# Patient Record
Sex: Female | Born: 1950 | Race: White | Hispanic: No | Smoking: Former smoker
Health system: Southern US, Community
[De-identification: ages and names within clinical notes are randomized; demographics above are authoritative.]

## PROBLEM LIST (undated history)

## (undated) DIAGNOSIS — M1990 Unspecified osteoarthritis, unspecified site: Secondary | ICD-10-CM

## (undated) DIAGNOSIS — I2510 Atherosclerotic heart disease of native coronary artery without angina pectoris: Secondary | ICD-10-CM

## (undated) DIAGNOSIS — E785 Hyperlipidemia, unspecified: Secondary | ICD-10-CM

## (undated) DIAGNOSIS — H409 Unspecified glaucoma: Secondary | ICD-10-CM

## (undated) DIAGNOSIS — N200 Calculus of kidney: Secondary | ICD-10-CM

## (undated) DIAGNOSIS — H269 Unspecified cataract: Secondary | ICD-10-CM

## (undated) DIAGNOSIS — M797 Fibromyalgia: Secondary | ICD-10-CM

## (undated) DIAGNOSIS — M810 Age-related osteoporosis without current pathological fracture: Secondary | ICD-10-CM

## (undated) HISTORY — DX: Unspecified cataract: H26.9

## (undated) HISTORY — DX: Hyperlipidemia, unspecified: E78.5

## (undated) HISTORY — DX: Calculus of kidney: N20.0

## (undated) HISTORY — PX: EYE SURGERY: SHX253

## (undated) HISTORY — PX: CERVICAL SPINE SURGERY: SHX589

## (undated) HISTORY — DX: Age-related osteoporosis without current pathological fracture: M81.0

## (undated) HISTORY — DX: Unspecified osteoarthritis, unspecified site: M19.90

## (undated) HISTORY — DX: Atherosclerotic heart disease of native coronary artery without angina pectoris: I25.10

## (undated) HISTORY — DX: Unspecified glaucoma: H40.9

## (undated) HISTORY — PX: SPINE SURGERY: SHX786

---

## 2000-01-24 ENCOUNTER — Other Ambulatory Visit: Admission: RE | Admit: 2000-01-24 | Discharge: 2000-01-24 | Payer: Self-pay | Admitting: *Deleted

## 2000-02-03 ENCOUNTER — Ambulatory Visit (HOSPITAL_COMMUNITY): Admission: RE | Admit: 2000-02-03 | Discharge: 2000-02-03 | Payer: Self-pay | Admitting: *Deleted

## 2000-02-03 ENCOUNTER — Encounter: Payer: Self-pay | Admitting: *Deleted

## 2001-01-31 ENCOUNTER — Other Ambulatory Visit: Admission: RE | Admit: 2001-01-31 | Discharge: 2001-01-31 | Payer: Self-pay | Admitting: *Deleted

## 2002-12-27 ENCOUNTER — Ambulatory Visit (HOSPITAL_COMMUNITY): Admission: RE | Admit: 2002-12-27 | Discharge: 2002-12-27 | Payer: Self-pay

## 2006-01-10 ENCOUNTER — Ambulatory Visit (HOSPITAL_COMMUNITY): Admission: RE | Admit: 2006-01-10 | Discharge: 2006-01-10 | Payer: Self-pay | Admitting: Internal Medicine

## 2008-12-17 ENCOUNTER — Ambulatory Visit (HOSPITAL_COMMUNITY): Admission: RE | Admit: 2008-12-17 | Discharge: 2008-12-17 | Payer: Self-pay | Admitting: Internal Medicine

## 2010-01-04 ENCOUNTER — Ambulatory Visit (HOSPITAL_COMMUNITY): Admission: RE | Admit: 2010-01-04 | Discharge: 2010-01-04 | Payer: Self-pay | Admitting: Internal Medicine

## 2010-04-28 ENCOUNTER — Ambulatory Visit (HOSPITAL_BASED_OUTPATIENT_CLINIC_OR_DEPARTMENT_OTHER): Admission: RE | Admit: 2010-04-28 | Payer: Medicare Other | Source: Ambulatory Visit | Admitting: Specialist

## 2011-06-13 ENCOUNTER — Ambulatory Visit (INDEPENDENT_AMBULATORY_CARE_PROVIDER_SITE_OTHER): Payer: Medicare Other | Admitting: Family Medicine

## 2011-06-13 VITALS — BP 147/83 | HR 76 | Temp 97.6°F | Resp 16

## 2011-06-13 DIAGNOSIS — R42 Dizziness and giddiness: Secondary | ICD-10-CM

## 2011-06-13 DIAGNOSIS — I10 Essential (primary) hypertension: Secondary | ICD-10-CM

## 2011-06-13 LAB — POCT CBC
Granulocyte percent: 69.3 %G (ref 37–80)
HCT, POC: 45.9 % (ref 37.7–47.9)
Hemoglobin: 14.8 g/dL (ref 12.2–16.2)
Lymph, poc: 0.9 (ref 0.6–3.4)
MCH, POC: 29.8 pg (ref 27–31.2)
MCHC: 32.2 g/dL (ref 31.8–35.4)
MCV: 92.3 fL (ref 80–97)
MID (cbc): 0.6 (ref 0–0.9)
MPV: 9.1 fL (ref 0–99.8)
POC Granulocyte: 3.3 (ref 2–6.9)
POC LYMPH PERCENT: 19 %L (ref 10–50)
POC MID %: 11.7 %M (ref 0–12)
Platelet Count, POC: 246 10*3/uL (ref 142–424)
RBC: 4.97 M/uL (ref 4.04–5.48)
RDW, POC: 14.3 %
WBC: 4.8 10*3/uL (ref 4.6–10.2)

## 2011-06-13 LAB — GLUCOSE, POCT (MANUAL RESULT ENTRY): POC Glucose: 94

## 2011-06-13 NOTE — Progress Notes (Signed)
Patient Name: Ruth Vaughn Date of Birth: October 23, 1950 Medical Record Number: [id number] Gender: female Date of Encounter: 06/13/2011  History of Present Illness:  Ruth Vaughn is a 61 y.o. very pleasant female patient who presents with the following:  This morning when she awoke she felt a little "disoriented." She felt as though she was not sure if she was still dreaming or awake.  Had an appt with Dr. Elmer Picker for her eyes this morning- she went ahead to her appt.  Felt nauseated and "like I might pass out" while she was there.  This seemed to occur in a few discrete episodes.  She was noted to have elevated BP at Dr. Deirdre Evener and her BP is usually normal to low: "110 or 100 over 90" is her typical BP.   Per Dr. Deirdre Evener note to [place] her BP was 140-150/ 77- 94.  She was given some juice but did not feel much better. She seemed "very nervous/ anxious." She also noted a throbbing in her head-" like I could feel my heart beating in my head."  No significant past medical history except for FBM and glaucoma.  Family history of HTN in several family members but no other heart disease. She denies any CP, no numbness or weakness in her limbs or face. Had noted some chest pressure/ tightness with episodes earlier but this is better now Elli does suffer from [facility] so she sometimes has pains in her chest/ shoulders but has not noted anything different lately.   She did work all day in her garden yesterday and felt fine- no CP or other symptoms Quit smoking about 30 years ago.     [name] is actually feeling quite a bit better now- she is here with her daughter and states that her company helps her feel better.     There is no problem list on file for this patient.  No past medical history on file. No past surgical history on file. History  Substance Use Topics  . Smoking status: Former Games developer  . Smokeless tobacco: Not on file  . Alcohol Use: Not on file   No family history on  file. Allergies  Allergen Reactions  . Codeine Swelling  . Penicillins Rash  . Sulfa Antibiotics Swelling and Rash    Medication list has been reviewed and updated.  Review of Systems: As per HPI- otherwise negative.  Physical Examination: Filed Vitals:   06/13/11 1403  BP: 154/71  Pulse: 80  Temp: 97.6 F (36.4 C)  TempSrc: Oral  Resp: 16  SpO2: 98%   See BP recheck and orthostatics on chart.  Could not find any other BP reading from the past in chart There is no height or weight on file to calculate BMI.  GEN: WDWN, NAD, Non-toxic, A & O x 3 HEENT: Atraumatic, Normocephalic. Neck supple. No masses, No LAD.  PEERL, EOMI, TM and oropharynx wnl Ears and Nose: No external deformity. CV: RRR, No M/G/R. No JVD. No thrill. No extra heart sounds. PULM: CTA B, no wheezes, crackles, rhonchi. No retractions. No resp. distress. No accessory muscle use. ABD: S, NT, ND, +BS. No rebound. No HSM. EXTR: No c/c/e NEURO Normal gait. Normal UE and LE strength and sensation, normal DTR, negative Romberg.  PSYCH: Normally interactive. Conversant. Not depressed or anxious appearing.  Calm demeanor.   Results for orders placed in visit on 06/13/11  POCT CBC      Component Value Range   WBC 4.8  4.6 - 10.2 (  K/uL)   Lymph, poc 0.9  0.6 - 3.4    POC LYMPH PERCENT 19.0  10 - 50 (%L)   MID (cbc) 0.6  0 - 0.9    POC MID % 11.7  0 - 12 (%M)   POC Granulocyte 3.3  2 - 6.9    Granulocyte percent 69.3  37 - 80 (%G)   RBC 4.97  4.04 - 5.48 (M/uL)   Hemoglobin 14.8  12.2 - 16.2 (g/dL)   HCT, POC 16.1  09.6 - 47.9 (%)   MCV 92.3  80 - 97 (fL)   MCH, POC 29.8  27 - 31.2 (pg)   MCHC 32.2  31.8 - 35.4 (g/dL)   RDW, POC 04.5     Platelet Count, POC 246  142 - 424 (K/uL)   MPV 9.1  0 - 99.8 (fL)  GLUCOSE, POCT (MANUAL RESULT ENTRY)      Component Value Range   POC Glucose 94     EKG: sinus rhythm, occasional PVC- no concerning features noted  Assessment and Plan: 1. Lightheaded  EKG 12-Lead,  POCT CBC, POCT glucose (manual entry), TSH  2. Hypertension  EKG 12-Lead, POCT CBC, POCT glucose (manual entry), TSH   Eber Jones noted symptoms that may have been due to a panic attack/ anxiety earlier today.  She is currently feeling a lot better and denies any CP.  Went over the limitations of clinic evaluation with Samhita and her daughter; I cannot rule- out cardiac problems here and cannot know for sure why her BP is elevated today.  A normal EKG is reassuring but is not proof that her heart is ok. Offered ED evaluation for cardiac rule- out- she declined at this time.  She plans to check her BP at the drug store over the next couple of days and will let me know if it continues to run high.  She also declined an outpatient cardiology evaluation at this time.  Again, she agreed to seek care if her symptoms return and will go to the ED if she feels like she did this morning or gets any CP.    Called patient at 8:30 pm to check on her- she denied CP and said that she was feeling ok.  We will also check her TSH as this could be causing her symptoms and follow- up when available

## 2011-06-14 ENCOUNTER — Telehealth: Payer: Self-pay | Admitting: *Deleted

## 2011-06-14 NOTE — Telephone Encounter (Signed)
Called patient to follow up from yesterday's visit.  Patient states that she is feeling better and got plenty of sleep last night.

## 2011-06-15 ENCOUNTER — Encounter: Payer: Self-pay | Admitting: Family Medicine

## 2013-01-28 ENCOUNTER — Ambulatory Visit (INDEPENDENT_AMBULATORY_CARE_PROVIDER_SITE_OTHER): Payer: Medicare Other | Admitting: Family Medicine

## 2013-01-28 VITALS — BP 100/62 | HR 73 | Temp 97.7°F | Resp 18 | Ht 59.0 in | Wt 111.0 lb

## 2013-01-28 DIAGNOSIS — R350 Frequency of micturition: Secondary | ICD-10-CM

## 2013-01-28 DIAGNOSIS — R32 Unspecified urinary incontinence: Secondary | ICD-10-CM

## 2013-01-28 LAB — POCT URINALYSIS DIPSTICK
Glucose, UA: NEGATIVE
Ketones, UA: NEGATIVE
Spec Grav, UA: 1.02
Urobilinogen, UA: 0.2

## 2013-01-28 LAB — POCT UA - MICROSCOPIC ONLY

## 2013-01-28 MED ORDER — CIPROFLOXACIN HCL 250 MG PO TABS: 500.0000 mg | ORAL_TABLET | Freq: Two times a day (BID) | ORAL | Status: DC

## 2013-01-28 NOTE — Patient Instructions (Signed)
Take the cipro twice a day for 5 days. Drink plenty of water and use pyridium if you would like.  If you are not feeling better in the next couple of days please let me konw

## 2013-01-28 NOTE — Progress Notes (Signed)
[facility] [address] [place] [address] [phone]  Date:  01/28/2013   Name:  Ruth Vaughn   DOB:  1950/08/29   MRN:  [id number]  PCP:  Delorse Lek, MD    Chief Complaint: Urinary Tract Infection   History of Present Illness:  Ruth Vaughn is a 62 y.o. very pleasant female patient who presents with the following:  She thinks she may have a UTI.  She has noted sx of urinary frequency, going just a little bit at a time, discomfort with urination.   She has not noted any hematuria.   She noted some sx a couple of weeks ago and seemed to get it under control.  She got better, but her sx worsening over the last couple of days.    No fever or chills.  Her lower back does hurt some.   No vomiting, no diarrhea.  She is eating normally. No vaginal sx She is generally healthy except she is disabled due to a neck injury in the past There are no active problems to display for this patient.   Past Medical History  Diagnosis Date  . Arthritis   . Cataract   . Glaucoma   . Osteoporosis     Past Surgical History  Procedure Laterality Date  . Cesarean section    . Eye surgery    . Spine surgery      History  Substance Use Topics  . Smoking status: Former Games developer  . Smokeless tobacco: Not on file  . Alcohol Use: Not on file    History reviewed. No pertinent family history.  Allergies  Allergen Reactions  . Codeine Swelling  . Erythromycin   . Penicillins Rash  . Sulfa Antibiotics Swelling and Rash    Medication list has been reviewed and updated.  No current outpatient prescriptions on file prior to visit.   No current facility-administered medications on file prior to visit.    Review of Systems:  As per HPI- otherwise negative.   Physical Examination: Filed Vitals:   01/28/13 1403  BP: 100/62  Pulse: 73  Temp: 97.7 F (36.5 C)  Resp: 18   Filed Vitals:   01/28/13 1403  Height: 4\' 11"  (1.499 m)  Weight: 111  lb (50.349 kg)   Body mass index is 22.41 kg/(m^2). Ideal Body Weight: Weight in (lb) to have BMI = 25: 123.5  GEN: WDWN, NAD, Non-toxic, A & O x 3 HEENT: Atraumatic, Normocephalic. Neck supple. No masses, No LAD. Ears and Nose: No external deformity. CV: RRR, No M/G/R. No JVD. No thrill. No extra heart sounds. PULM: CTA B, no wheezes, crackles, rhonchi. No retractions. No resp. distress. No accessory muscle use. ABD: S, NT, ND. No rebound. No HSM. EXTR: No c/c/e NEURO Normal gait.  PSYCH: Normally interactive. Conversant. Not depressed or anxious appearing.  Calm demeanor.  No CVA tenderness   Results for orders placed in visit on 01/28/13  POCT URINALYSIS DIPSTICK      Result Value Range   Color, UA yellow     Clarity, UA cloudy     Glucose, UA neg     Bilirubin, UA neg     Ketones, UA neg     Spec Grav, UA 1.020     Blood, UA moderate     pH, UA 5.5     Protein, UA neg     Urobilinogen, UA 0.2     Nitrite, UA neg  Leukocytes, UA moderate (2+)    POCT UA - MICROSCOPIC ONLY      Result Value Range   WBC, Ur, HPF, POC tntc     RBC, urine, microscopic 3-6     Bacteria, U Microscopic trace     Mucus, UA neg     Epithelial cells, urine per micros 0-1     Crystals, Ur, HPF, POC tntc uric acid crystals     Casts, Ur, LPF, POC 3-6 calcium oxalates     Yeast, UA neg       Assessment and Plan: Frequent urination - Plan: POCT urinalysis dipstick, POCT UA - Microscopic Only, ciprofloxacin (CIPRO) 250 MG tablet, Urine culture  Bladder incontinence - Plan: POCT urinalysis dipstick, POCT UA - Microscopic Only  cipro for UTI.  Await urine culture  See patient instructions for more details.     Signed Abbe Amsterdam, MD

## 2013-01-31 LAB — URINE CULTURE

## 2015-12-17 LAB — VITAMIN D 25 HYDROXY (VIT D DEFICIENCY, FRACTURES)
VIT D 25 HYDROXY: 56.4
Vit D, 25-Hydroxy: 56.4

## 2015-12-17 LAB — CBC AND DIFFERENTIAL
HEMATOCRIT: 46 (ref 36–46)
HEMATOCRIT: 46 (ref 36–46)
HEMOGLOBIN: 15.1 (ref 12.0–16.0)
Hemoglobin: 15.1 (ref 12.0–16.0)
PLATELETS: 244 (ref 150–399)
Platelets: 244 (ref 150–399)
WBC: 4.5
WBC: 4.5

## 2015-12-17 LAB — TSH
TSH: 1.52 (ref 0.41–5.90)
TSH: 1.52 (ref 0.41–5.90)

## 2015-12-17 LAB — VITAMIN B12: VITAMIN B 12: 154

## 2016-03-15 DIAGNOSIS — R5383 Other fatigue: Secondary | ICD-10-CM | POA: Diagnosis not present

## 2016-03-15 DIAGNOSIS — D519 Vitamin B12 deficiency anemia, unspecified: Secondary | ICD-10-CM | POA: Diagnosis not present

## 2016-05-03 DIAGNOSIS — L821 Other seborrheic keratosis: Secondary | ICD-10-CM | POA: Diagnosis not present

## 2016-05-03 DIAGNOSIS — L603 Nail dystrophy: Secondary | ICD-10-CM | POA: Diagnosis not present

## 2016-06-24 ENCOUNTER — Ambulatory Visit: Payer: Medicare Other | Admitting: Family Medicine

## 2016-07-25 ENCOUNTER — Ambulatory Visit (INDEPENDENT_AMBULATORY_CARE_PROVIDER_SITE_OTHER): Payer: PPO | Admitting: Family Medicine

## 2016-07-25 VITALS — BP 105/62 | HR 77 | Temp 97.9°F | Ht 59.0 in | Wt 115.0 lb

## 2016-07-25 DIAGNOSIS — F430 Acute stress reaction: Secondary | ICD-10-CM

## 2016-07-25 DIAGNOSIS — F339 Major depressive disorder, recurrent, unspecified: Secondary | ICD-10-CM | POA: Diagnosis not present

## 2016-07-25 DIAGNOSIS — R252 Cramp and spasm: Secondary | ICD-10-CM

## 2016-07-25 DIAGNOSIS — E538 Deficiency of other specified B group vitamins: Secondary | ICD-10-CM

## 2016-07-25 MED ORDER — VENLAFAXINE HCL ER 37.5 MG PO CP24: 37.5000 mg | ORAL_CAPSULE | Freq: Every day | ORAL | 6 refills | Status: DC

## 2016-07-25 NOTE — Patient Instructions (Addendum)
It was nice to see you again today!   We will have you start on effexor 37.5 one a day, and increase to 2 pills after 1-2 weeks if needed We will check your B12 today We will also look for any electrolyte issue that could be causing your cramps

## 2016-07-25 NOTE — Progress Notes (Addendum)
[name] [address], [place], [place] [address] [phone]  Date:  07/25/2016   Name:  Ruth Vaughn   DOB:  July 27, 1950   MRN:  [id number]  PCP:  Darreld Mclean, MD    Chief Complaint: Establish Care (Needs B12 injection today.)   History of Present Illness:  Ruth Vaughn is a 66 y.o. very pleasant female patient who presents with the following:  Here today as a new patient to this practice but I do know her from [facility]- last saw her there in 2014 She has not really had a local PCP recently- she lives near this office now and wanted to establish care.    She does have a history of B12 def- she has been doing shots once a week at home (a friend will give it to her).  However she has been out for a few weeks and wonders if she still needs to get these shots.  We will check her level today  She did get some labs done in January- however we will have to get her results from her last provider She has complaint of feeling very tired- will wake up feeling tired.  This has been doing on for a year or so.   She does not feel sleepy per se, but will feel "like I"m dragging."  At night she generally gets 6 + hours of sleep.  She does not think that she has OSA as her sx are not somnolence but fatigue, feeling like she is not motivated to do things   No CP or SOB She notes a history of osteopenia/ osteoporosis   Admits to being under a lot of stress recently her mother died, she had to put her dog to sleep, and her grown son had a nervous breakdown which required psychiatric treatment.  He is doing better but is living with her again.  All in all she suspects that she has been depressed for the last several months  She did try wellbutrin years ago and did not like it - however she would be interested in trying a different medication She was also treated for depression in the 1980s but does not recall what she  took She does not feel that anxiety is a bit issue for her- more lack of energy Denies any SI  She has noted muscle cramps over the last couple of year at night- mostly in her legs, occasionally in her hands if she has been using them a lot (knitting)  Patient Active Problem List   Diagnosis Date Noted  . Vitamin B12 deficiency 07/25/2016  . Depression, recurrent ([place]) 07/25/2016    Past Medical History:  Diagnosis Date  . Arthritis   . Cataract   . Glaucoma   . Osteoporosis     Past Surgical History:  Procedure Laterality Date  . CESAREAN SECTION    . EYE SURGERY    . SPINE SURGERY      Social History  Substance Use Topics  . Smoking status: Former Research scientist (life sciences)  . Smokeless tobacco: Not on file  . Alcohol use Not on file    No family history on file.  Allergies  Allergen Reactions  . Codeine Swelling  . Erythromycin   . Penicillins Rash  . Sulfa Antibiotics Swelling and Rash    Medication list has been reviewed and updated.  Current Outpatient Prescriptions on File Prior to Visit  Medication Sig  Dispense Refill  . Cholecalciferol (VITAMIN D PO) Take by mouth.    . Fish Oil OIL by Does not apply route.     No current facility-administered medications on file prior to visit.     Review of Systems:  As per HPI- otherwise negative.   Physical Examination: Vitals:   07/25/16 1414  BP: 105/62  Pulse: 77  Temp: 97.9 F (36.6 C)   Vitals:   07/25/16 1414  Weight: 115 lb (52.2 kg)  Height: 4\' 11"  (1.499 m)   Body mass index is 23.23 kg/m. Ideal Body Weight: Weight in (lb) to have BMI = 25: 123.5  GEN: WDWN, NAD, Non-toxic, A & O x 3, normal weight, looks well HEENT: Atraumatic, Normocephalic. Neck supple. No masses, No LAD. Ears and Nose: No external deformity. CV: RRR, No M/G/R. No JVD. No thrill. No extra heart sounds. PULM: CTA B, no wheezes, crackles, rhonchi. No retractions. No resp. distress. No accessory muscle use. ABD: S, NT, ND, +BS. No  rebound. No HSM. EXTR: No c/c/e NEURO Normal gait.  PSYCH: Normally interactive. Conversant. Not depressed or anxious appearing.  Calm demeanor.    Assessment and Plan: ,Depression, recurrent ([place]) - Plan: venlafaxine XR (EFFEXOR-XR) 37.5 MG 24 hr capsule  Vitamin B12 deficiency - Plan: B12  Muscle cramps - Plan: Basic metabolic panel  Acute stress reaction  Here today with a few concerns Her main problem is depression for the kast few months Will try her on effexor starting at 37.5- can increase after 1-2 weeks Will check on her b12 level and also BMP given muscle cramps  Will plan further follow- up pending labs. Asked her to keep me posted regarding her response to effexor via mychart   Signed Lamar Blinks, MD  Received her labs   Results for orders placed or performed in visit on 07/25/16  B12  Result Value Ref Range   Vitamin B-12 959 (H) 211 - 911 pg/mL  Basic metabolic panel  Result Value Ref Range   Sodium 141 135 - 145 mEq/L   Potassium 3.7 3.5 - 5.1 mEq/L   Chloride 106 96 - 112 mEq/L   CO2 28 19 - 32 mEq/L   Glucose, Bld 100 (H) 70 - 99 mg/dL   BUN 13 6 - 23 mg/dL   Creatinine, Ser 0.87 0.40 - 1.20 mg/dL   Calcium 9.6 8.4 - 10.5 mg/dL   GFR 69.32 >60.00 mL/min   Message to pt

## 2016-07-26 ENCOUNTER — Encounter: Payer: Self-pay | Admitting: Family Medicine

## 2016-07-26 LAB — BASIC METABOLIC PANEL
BUN: 13 mg/dL (ref 6–23)
CO2: 28 mEq/L (ref 19–32)
CREATININE: 0.87 mg/dL (ref 0.40–1.20)
Calcium: 9.6 mg/dL (ref 8.4–10.5)
Chloride: 106 mEq/L (ref 96–112)
GFR: 69.32 mL/min (ref >60.00)
Glucose, Bld: 100 mg/dL — ABNORMAL HIGH (ref 70–99)
POTASSIUM: 3.7 meq/L (ref 3.5–5.1)
Sodium: 141 mEq/L (ref 135–145)

## 2016-07-26 LAB — VITAMIN B12: Vitamin B-12: 959 pg/mL — ABNORMAL HIGH (ref 211–911)

## 2016-07-28 DIAGNOSIS — H04123 Dry eye syndrome of bilateral lacrimal glands: Secondary | ICD-10-CM | POA: Diagnosis not present

## 2016-07-28 DIAGNOSIS — H1852 Epithelial (juvenile) corneal dystrophy: Secondary | ICD-10-CM | POA: Diagnosis not present

## 2016-07-28 DIAGNOSIS — H401131 Primary open-angle glaucoma, bilateral, mild stage: Secondary | ICD-10-CM | POA: Diagnosis not present

## 2016-08-30 ENCOUNTER — Telehealth: Payer: Self-pay | Admitting: Family Medicine

## 2016-08-30 ENCOUNTER — Emergency Department (HOSPITAL_BASED_OUTPATIENT_CLINIC_OR_DEPARTMENT_OTHER)
Admission: EM | Admit: 2016-08-30 | Discharge: 2016-08-30 | Disposition: A | Discharge status: Home / Self Care | Payer: PPO | Attending: Emergency Medicine | Admitting: Emergency Medicine

## 2016-08-30 ENCOUNTER — Encounter (HOSPITAL_BASED_OUTPATIENT_CLINIC_OR_DEPARTMENT_OTHER): Payer: Self-pay

## 2016-08-30 ENCOUNTER — Emergency Department (HOSPITAL_BASED_OUTPATIENT_CLINIC_OR_DEPARTMENT_OTHER): Payer: PPO

## 2016-08-30 DIAGNOSIS — R072 Precordial pain: Secondary | ICD-10-CM | POA: Insufficient documentation

## 2016-08-30 DIAGNOSIS — Z87891 Personal history of nicotine dependence: Secondary | ICD-10-CM | POA: Insufficient documentation

## 2016-08-30 DIAGNOSIS — R079 Chest pain, unspecified: Secondary | ICD-10-CM | POA: Diagnosis not present

## 2016-08-30 HISTORY — DX: Fibromyalgia: M79.7

## 2016-08-30 LAB — CBC
HCT: 46.8 % — ABNORMAL HIGH (ref 36.0–46.0)
HEMOGLOBIN: 16 g/dL — AB (ref 12.0–15.0)
MCH: 31.4 pg (ref 26.0–34.0)
MCHC: 34.2 g/dL (ref 30.0–36.0)
MCV: 91.9 fL (ref 78.0–100.0)
Platelets: 237 10*3/uL (ref 150–400)
RBC: 5.09 MIL/uL (ref 3.87–5.11)
RDW: 13.9 % (ref 11.5–15.5)
WBC: 5.6 10*3/uL (ref 4.0–10.5)

## 2016-08-30 LAB — TROPONIN I
Troponin I: <0.03 ng/mL (ref <0.03)
Troponin I: <0.03 ng/mL (ref <0.03)

## 2016-08-30 LAB — BASIC METABOLIC PANEL
ANION GAP: 11 (ref 5–15)
BUN: 12 mg/dL (ref 6–20)
CHLORIDE: 102 mmol/L (ref 101–111)
CO2: 27 mmol/L (ref 22–32)
Calcium: 9.6 mg/dL (ref 8.9–10.3)
Creatinine, Ser: 0.93 mg/dL (ref 0.44–1.00)
GFR calc non Af Amer: >60 mL/min (ref >60)
Glucose, Bld: 108 mg/dL — ABNORMAL HIGH (ref 65–99)
POTASSIUM: 4.5 mmol/L (ref 3.5–5.1)
Sodium: 140 mmol/L (ref 135–145)

## 2016-08-30 LAB — D-DIMER, QUANTITATIVE: D-Dimer, Quant: <0.27 ug/mL-FEU (ref 0.00–0.50)

## 2016-08-30 MED ORDER — ASPIRIN 81 MG PO CHEW
324.0000 mg | CHEWABLE_TABLET | Freq: Once | ORAL | Status: AC
  Administered 2016-08-30: 324 mg via ORAL
  Filled 2016-08-30: qty 4

## 2016-08-30 NOTE — Telephone Encounter (Signed)
Pt called in to schedule an apt . Pt says that she is having chest pain and feels very tired. Transferred pt to [facility] for triaging.

## 2016-08-30 NOTE — Telephone Encounter (Signed)
Patient Name: Ruth Vaughn  Gender: Female  DOB: 03-26-1950   Age: 66 Y 15 M 18 D  Return Phone Number: [phone] (Primary)  Address:   City/State/Zip: [place]    [employer]ate [employer] [facility] - Client  Client Site [facility] - Day  Physician Copland, Janett Billow- MD  Contact Type Call  Who Is Calling Patient / Member / Family / Caregiver  Call Type Triage / Clinical  Relationship To Patient Self  Return Phone Number [phone] (Primary)  Chief Complaint CHEST PAIN (>=21 years) - pain, pressure, heaviness or tightness  Reason for Call Symptomatic / Request for [place] states she has chest pain, nausea and fatigue  Appointment Disposition EMR Appointment Not Necessary  PreDisposition Call Doctor  Translation No   Nurse Assessment  Nurse: Leilani Merl, RN, Heather Date/Time (Eastern Time): 08/30/2016 11:14:14 AM  Confirm and document reason for call. If symptomatic, describe symptoms. ---Caller states she has chest pain, nausea and fatigue. The fatigue started weeks ago, the chest pain started a couple of days ago. Nausea started a couple of days ago.  Does the patient have any new or worsening symptoms? ---Yes  Will a triage be completed? ---Yes  Related visit to physician within the last 2 weeks? ---No  Does the PT have any chronic conditions? (i.e. diabetes, asthma, etc.) ---Yes  List chronic conditions. ---See MR  Is this a behavioral health or substance abuse call? ---No     Guidelines      Guideline Title Affirmed Question Affirmed Notes Nurse Date/Time Eilene Ghazi Time)  Chest Pain [1] Chest pain lasts > 5 minutes AND [2] age > 98  Standifer, RN, Nira Conn 08/30/2016 11:17:26 AM   Disp. Time Eilene Ghazi Time) Disposition Final User   08/30/2016 11:12:05 AM Send To Call Back Waiting For Nurse  [facility]   08/30/2016 11:12:05 AM Send To Call Back Waiting For Nurse  [facility]   08/30/2016 11:12:08 AM Send to  Urgent Queue  [facility]   08/30/2016 11:23:03 AM 911 Outcome Documentation  [place], RN, Nira Conn    Reason: Caller states that she will go to the ED, but she will not call 911 to take her.      08/30/2016 11:19:52 AM Call EMS 911 Now Yes Standifer, RN, Soyla Murphy Understands: Yes  Disagree/Comply: Comply     Care Advice Given Per Guideline      CALL EMS 911 NOW: Immediate medical attention is needed. You need to hang up and call 911 (or an ambulance). Psychologist, forensic Discretion: I'll call you back in a few minutes to be sure you were able to reach them.) CARE ADVICE given per Chest Pain (Adult) guideline.   Referrals  [facility] - ED

## 2016-08-30 NOTE — ED Triage Notes (Signed)
C/o chest tightness x 1-2 weeks-later became sharp in nature-NAD-steady gait

## 2016-08-30 NOTE — ED Provider Notes (Signed)
[facility] DEPT [facility] Provider Note   CSN: [id number] Arrival date & time: 08/30/16  1134     History   Chief Complaint Chief Complaint  Patient presents with  . Chest Pain    HPI Ruth Vaughn is a 66 y.o. female.  The history is provided by the patient and medical records. No language interpreter was used.  Chest Pain   This is a new problem. The problem occurs rarely. The problem has not changed since onset.The pain is associated with breathing. The pain is present in the substernal region. The pain is at a severity of 3/10. The pain is mild. The quality of the pain is described as brief, pleuritic and sharp. The pain does not radiate. Duration of episode(s) is 5 minutes. The symptoms are aggravated by deep breathing. Associated symptoms include nausea and shortness of breath. Pertinent negatives include no abdominal pain, no back pain, no claudication, no cough, no diaphoresis, no dizziness, no exertional chest pressure, no fever, no headaches, no hemoptysis, no irregular heartbeat, no leg pain, no lower extremity edema, no malaise/fatigue, no near-syncope, no numbness, no palpitations, no sputum production, no syncope and no vomiting. She has tried nothing for the symptoms. The treatment provided no relief.    Past Medical History:  Diagnosis Date  . Arthritis   . Cataract   . Fibromyalgia   . Glaucoma   . Osteoporosis     Patient Active Problem List   Diagnosis Date Noted  . Vitamin B12 deficiency 07/25/2016  . Depression, recurrent ([place]) 07/25/2016    Past Surgical History:  Procedure Laterality Date  . CERVICAL SPINE SURGERY    . CESAREAN SECTION    . EYE SURGERY    . SPINE SURGERY      OB History    No data available       Home Medications    Prior to Admission medications   Medication Sig Start Date End Date Taking? Authorizing Provider  b complex vitamins tablet Take 1 tablet by mouth daily.    [provider]  Cholecalciferol  (VITAMIN D PO) Take by mouth.    [provider]  cyanocobalamin (,VITAMIN B-12,) 1000 MCG/ML injection Inject 1,000 mcg into the muscle once.    [provider]  Fish Oil OIL by Does not apply route.    [provider]  THEANINE PO Take 200 mg by mouth.    [provider]    Family History No family history on file.  Social History Social History  Substance Use Topics  . Smoking status: Former Research scientist (life sciences)  . Smokeless tobacco: Never Used  . Alcohol use Yes     Comment: daily     Allergies   Codeine; Erythromycin; Penicillins; and Sulfa antibiotics   Review of Systems Review of Systems  Constitutional: Negative for appetite change, chills, diaphoresis, fatigue, fever and malaise/fatigue.  HENT: Negative for congestion and rhinorrhea.   Eyes: Negative for visual disturbance.  Respiratory: Positive for shortness of breath. Negative for cough, hemoptysis, sputum production, chest tightness and stridor.   Cardiovascular: Positive for chest pain. Negative for palpitations, claudication, leg swelling, syncope and near-syncope.  Gastrointestinal: Positive for nausea. Negative for abdominal pain, constipation, diarrhea and vomiting.  Genitourinary: Negative for dysuria.  Musculoskeletal: Negative for back pain.  Neurological: Negative for dizziness, light-headedness, numbness and headaches.  Psychiatric/Behavioral: Negative for agitation.  All other systems reviewed and are negative.    Physical Exam Updated Vital Signs BP (!) 143/79 (BP Location: Right  Arm)   Pulse 74   Temp 98.4 F (36.9 C) (Oral)   Resp 20   Ht 4\' 11"  (1.499 m)   Wt 52.6 kg (116 lb)   SpO2 100%   BMI 23.43 kg/m   Physical Exam  Constitutional: She is oriented to person, place, and time. She appears well-developed and well-nourished. No distress.  HENT:  Head: Normocephalic and atraumatic.  Right Ear: External ear normal.  Left Ear: External ear normal.  Nose: Nose  normal.  Mouth/Throat: Oropharynx is clear and moist. No oropharyngeal exudate.  Eyes: Conjunctivae and EOM are normal. Pupils are equal, round, and reactive to light.  Neck: Normal range of motion. Neck supple.  Cardiovascular: Normal rate, normal heart sounds and intact distal pulses.   No murmur heard. Pulmonary/Chest: Effort normal and breath sounds normal. No stridor. No respiratory distress. She has no wheezes. She exhibits no tenderness.  Abdominal: She exhibits no distension. There is no tenderness. There is no rebound.  Musculoskeletal: She exhibits no tenderness.  Neurological: She is alert and oriented to person, place, and time. She has normal reflexes. She exhibits normal muscle tone. Coordination normal.  Skin: Skin is warm. Capillary refill takes less than 2 seconds. No rash noted. She is not diaphoretic. No erythema.  Nursing note and vitals reviewed.    ED Treatments / Results  Labs (all labs ordered are listed, but only abnormal results are displayed) Labs Reviewed  BASIC METABOLIC PANEL - Abnormal; Notable for the following:       Result Value   Glucose, Bld 108 (*)    All other components within normal limits  CBC - Abnormal; Notable for the following:    Hemoglobin 16.0 (*)    HCT 46.8 (*)    All other components within normal limits  TROPONIN I  D-DIMER, QUANTITATIVE (NOT AT [facility])  TROPONIN I    EKG  EKG Interpretation  Date/Time:  Tuesday August 30 2016 11:44:09 EDT Ventricular Rate:  78 PR Interval:  114 QRS Duration: 80 QT Interval:  358 QTC Calculation: 408 R Axis:   75 Text Interpretation:  Normal sinus rhythm Normal ECG No prior ECG for comparison No STEMI Confirmed by Antony Blackbird [phone]) on 08/30/2016 12:34:34 PM       Radiology Dg Chest 2 View  Result Date: 08/30/2016 CLINICAL DATA:  Fatigue for several months, chest pain, EXAM: CHEST  2 VIEW COMPARISON:  None. FINDINGS: No active infiltrate or effusion is seen. Mediastinal and hilar  contours are unremarkable. On the lateral view of the chest there is a vague nodular opacity posteriorly which may simply be due to overlapping bony and vascular structures, but attention to this area on followup chest x-ray is recommended. The heart is within normal limits in size. No acute bony abnormality is seen. IMPRESSION: 1. No active cardiopulmonary disease. 2. Vague nodular opacity seen only on the lateral view. Possible superimposed vascular and bony structures, but attention to this area on followup chest x-ray is recommended. Electronically Signed   By: Ivar Drape M.D.   On: 08/30/2016 12:37    Procedures Procedures (including critical care time)  Medications Ordered in ED Medications  aspirin chewable tablet 324 mg (324 mg Oral Given 08/30/16 1254)     Initial Impression / Assessment and Plan / ED Course  I have reviewed the triage vital signs and the nursing notes.  Pertinent labs & imaging results that were available during my care of the patient were reviewed by me and considered  in my medical decision making (see chart for details).     Ruth Vaughn is a 66 y.o. female with past medical history significant for fibromyalgia who presents with chest pain. Patient says that over the last 2 weeks she has had intermittent chest discomfort. She says it was tightness but then changed into a sharp pain. She says that she had some mild associated nausea and shortness of breath but denies any vomiting. She denies any palpitations or syncope. She denies lightheadedness. She does report that her pain is worsened with deep breathing. She denies any recent chest trauma. She denies fevers, chills, congestion, cough, sensation, diarrhea, or dysuria. She says the pain is currently 3 out of 10 in severity. She denies any other symptoms including no leg pain or leg swelling. No significant fatigue.  History and exam are seen above. On exam, patient's lungs are clear. Chest is nontender however  she did have pain with deep breathing on exam. No abdominal tenderness. Symmetric pulses in upper extremity. No focal neurologic deficits.  Patient given aspirin during initial workup for chest pain.  Heart score calculated as a 2. EKG unremarkable. Laboratory testing showed elevated hemoglobin but otherwise no evidence of acute abnormalities. D-dimer negative, doubt PE. Troponin negative 2.  Patient had no further chest pain in the ED.  Given reassuring workup and heart score less than 3 with negative troponins, feel patient is safe for outpatient management of chest pain. Patient reports that she has been feeling anxious with the pain and thinks that it may be partially an anxiety attack. Patient advised to follow-up with PCP and likely a cardiologist for further management. Patient understood return precautions for any new or worsened symptoms. Patient had no other questions or concerns and was discharged in good condition.        Final Clinical Impressions(s) / ED Diagnoses   Final diagnoses:  Precordial chest pain    New Prescriptions Discharge Medication List as of 08/30/2016  3:40 PM      Clinical Impression: 1. Precordial chest pain     Disposition: Discharge  Condition: Good  I have discussed the results, Dx and Tx plan with the pt(& family if present). He/she/they expressed understanding and agree(s) with the plan. Discharge instructions discussed at great length. Strict return precautions discussed and pt &/or family have verbalized understanding of the instructions. No further questions at time of discharge.    Discharge Medication List as of 08/30/2016  3:40 PM      Follow Up: Darreld Mclean, MD [address] [place] [address] [phone]  Schedule an appointment as soon as possible for a visit    [facility] [address] [id number] [place] [id number] [phone]  If  symptoms worsen     Una Yeomans, Gwenyth Allegra, MD 08/30/16 1556

## 2016-08-30 NOTE — Discharge Instructions (Signed)
Please schedule a follow-up appointment with your primary care physician for further workup and management of your chest discomfort. Your workup today was reassuring. If any symptoms change or worsen, please return to the nearest emergency department.

## 2016-08-30 NOTE — ED Notes (Signed)
Patient transported to X-ray 

## 2016-11-07 ENCOUNTER — Encounter: Payer: Self-pay | Admitting: Family Medicine

## 2016-11-20 ENCOUNTER — Telehealth: Payer: Self-pay | Admitting: Family Medicine

## 2016-11-20 NOTE — Telephone Encounter (Signed)
Received her records from [facility]- will abstract/ scan as needed

## 2016-11-21 ENCOUNTER — Encounter: Payer: Self-pay | Admitting: Family Medicine

## 2016-11-21 NOTE — Progress Notes (Signed)
MCV: 94 T4 FREE: 1.2 VIT B12: 154 T3, TOTAL: 93

## 2016-11-25 ENCOUNTER — Ambulatory Visit (INDEPENDENT_AMBULATORY_CARE_PROVIDER_SITE_OTHER): Payer: PPO | Admitting: Podiatry

## 2016-11-25 ENCOUNTER — Ambulatory Visit (INDEPENDENT_AMBULATORY_CARE_PROVIDER_SITE_OTHER): Payer: PPO

## 2016-11-25 ENCOUNTER — Encounter: Payer: Self-pay | Admitting: Podiatry

## 2016-11-25 DIAGNOSIS — M21619 Bunion of unspecified foot: Secondary | ICD-10-CM

## 2016-11-25 DIAGNOSIS — B353 Tinea pedis: Secondary | ICD-10-CM | POA: Diagnosis not present

## 2016-11-25 DIAGNOSIS — L603 Nail dystrophy: Secondary | ICD-10-CM | POA: Diagnosis not present

## 2016-11-25 MED ORDER — KETOCONAZOLE 2 % EX CREA: 1.0000 "application " | TOPICAL_CREAM | Freq: Two times a day (BID) | CUTANEOUS | 1 refills | Status: DC

## 2016-11-25 NOTE — Progress Notes (Signed)
   Subjective:    Patient ID: Ruth Vaughn, female    DOB: 1951/02/14, 66 y.o.   MRN: [id number]  HPI  66 year old female presents the office today for concerns of the bunions the left foot which is been ongoing for several months and she wants to do something preventatively to help prevent continued worse. She does not one have any surgery in the foot and she can avoid it. She states the area does become painful in shoes and with pressure at times but not consistently. She said no recent treatment for her. She also states that her left fifth toenail has become thick and discolored. She is also noticing dry, peeling skin to the plantar aspect of left foot but not on the right foot. The left foot does itch at times. She has no recent treatment for this and she has no other concerns.  Review of Systems  All other systems reviewed and are negative.      Objective:   Physical Exam General: AAO x3, NAD  Dermatological: Left fifth digit toenails hypertrophic, dystrophic with yellow to brown discoloration. There is no pain of the nail is no surrounding redness or drainage.The other nails appear to be clear. There is dry, peeling skin on the plantar aspect of the foot which subsequently does itching there is mild erythema and there is mild tinea pedis present. There is no open lesions or pre-ulcerative lesions but otherwise.  Vascular: Dorsalis Pedis artery and Posterior Tibial artery pedal pulses are 2/4 bilateral with immedate capillary fill time. No varicosities and no lower extremity edema present bilateral. There is no pain with calf compression, swelling, warmth, erythema.   Neruologic: Grossly intact via light touch bilateral. Protective threshold with Semmes Wienstein monofilament intact to all pedal sites bilateral.   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 On the left foot there is mild to moderate HAV present there is no pain or crepitation first MPJ range of  motion. There is no pain along the bunion today but subjectively she does get discomfort on the medial aspect of the bunion. There is no hypermobility the first ray present. There is no other area tenderness within 5 bilaterally. Muscular strength 5/5 in all groups tested bilateral.  Gait: Unassisted, Nonantalgic.      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 Moderate HAV is present. There is no evidence of acute fracture. -Regards the bunion we discussed both conservative as well as surgical treatment options. We will try reviewed and we can't avoid any surgical intervention. We discussed shoe gear modifications, inserts, offloading padding. There is no pain today so we're and hold off on a steroid injection but long-term this an option for her to become symptomatic. -Regards the fifth digit toenail is more from damage to the nail. Discussed the urea cream to help from the nail -Prescribed ketoconazole for tinea pedis  Celesta Gentile, DPM

## 2016-11-28 ENCOUNTER — Telehealth: Payer: Self-pay | Admitting: Podiatry

## 2016-11-28 NOTE — Telephone Encounter (Signed)
I saw Dr. Jacqualyn Posey and he stated he was going to send two prescriptions to [facility]. I just got off of the phone with them and they said they had one prescription. Would you please either call me or the pharmacy back to let me know what to do about the other prescription. You can reach me at [phone]. Thank you.

## 2016-12-05 ENCOUNTER — Telehealth: Payer: Self-pay | Admitting: *Deleted

## 2016-12-05 NOTE — Telephone Encounter (Signed)
Called patient last week just to make sure someone from here called and patient stated that no one has called and I stated that I was just checking and patient needed to get the other RX which was the [facility] urea cream 40% and I called today and stated that I had sent the RX to [facility] and that they would be calling in a few days and hopefully could get the cream patient was needing and to call the office if any questions or concerns. Ruth Vaughn

## 2016-12-08 ENCOUNTER — Encounter: Payer: Self-pay | Admitting: Podiatry

## 2016-12-09 NOTE — Telephone Encounter (Signed)
Spoke with patient and explained that the one RX will go to her pharmacy and the other is coming from [facility] and they should be getting in touch with you soon and if you do not hear from [facility] please let me know and if any questions or concerns please let me know. Lattie Haw

## 2016-12-23 ENCOUNTER — Telehealth: Payer: Self-pay | Admitting: Family Medicine

## 2016-12-23 NOTE — Telephone Encounter (Signed)
Copied from [facility] [id number]. Topic: Quick Communication - See Telephone Encounter >> Dec 23, 2016 10:44 AM Gerrie Nordmann wrote: CRM for notification. See Telephone encounter for:  12/23/16.

## 2017-01-04 ENCOUNTER — Encounter: Payer: Self-pay | Admitting: Family Medicine

## 2017-01-06 ENCOUNTER — Telehealth: Payer: Self-pay | Admitting: *Deleted

## 2017-01-06 NOTE — Telephone Encounter (Signed)
Received Medical records from [facility]; forwarded to provider/SLS 11/16

## 2017-01-10 ENCOUNTER — Telehealth: Payer: Self-pay | Admitting: Family Medicine

## 2017-01-10 DIAGNOSIS — E559 Vitamin D deficiency, unspecified: Secondary | ICD-10-CM

## 2017-01-10 NOTE — Telephone Encounter (Signed)
Received her records from [facility]- will abstract/ scan as needed

## 2017-01-11 ENCOUNTER — Encounter: Payer: Self-pay | Admitting: Family Medicine

## 2017-01-20 NOTE — Progress Notes (Deleted)
Subjective:   Ruth Vaughn is a 66 y.o. female who presents for Medicare Annual (Subsequent) preventive examination.  Review of Systems:  No ROS.  Medicare Wellness Visit. Additional risk factors are reflected in the social history.    Sleep patterns: Home Safety/Smoke Alarms: Feels safe in home. Smoke alarms in place.  Living environment; residence and Firearm Safety:   Female:   Pap-       Mammo-       Dexa scan-        CCS-     Objective:     Vitals: There were no vitals taken for this visit.  There is no height or weight on file to calculate BMI.  Advanced Directives 08/30/2016  Does Patient Have a Medical Advance Directive? Yes  Type of Paramedic of [place];Living will    Tobacco Social History   Tobacco Use  Smoking Status Former Smoker  Smokeless Tobacco Never Used     Counseling given: Not Answered    Past Medical History:  Diagnosis Date  . Arthritis   . Cataract   . Fibromyalgia   . Glaucoma   . Osteoporosis    Past Surgical History:  Procedure Laterality Date  . CERVICAL SPINE SURGERY    . CESAREAN SECTION    . EYE SURGERY    . SPINE SURGERY     No family history on file. Social History   Socioeconomic History  . Marital status: Divorced    Spouse name: Not on file  . Number of children: Not on file  . Years of education: Not on file  . Highest education level: Not on file  Social Needs  . Financial resource strain: Not on file  . Food insecurity - worry: Not on file  . Food insecurity - inability: Not on file  . Transportation needs - medical: Not on file  . Transportation needs - non-medical: Not on file  Occupational History  . Not on file  Tobacco Use  . Smoking status: Former Research scientist (life sciences)  . Smokeless tobacco: Never Used  Substance and Sexual Activity  . Alcohol use: Yes    Comment: daily  . Drug use: No  . Sexual activity: Not on file  Other Topics Concern  . Not on file  Social History  Narrative  . Not on file    Outpatient Encounter Medications as of 01/24/2017  Medication Sig  . b complex vitamins tablet Take 1 tablet by mouth daily.  . Cholecalciferol (VITAMIN D PO) Take by mouth.  . cyanocobalamin (,VITAMIN B-12,) 1000 MCG/ML injection Inject 1,000 mcg into the muscle once.  . Fish Oil OIL by Does not apply route.  Marland Kitchen ketoconazole (NIZORAL) 2 % cream Apply 1 application topically 2 (two) times daily.  . NON FORMULARY [facility]  Urea Cream - 40% Urea Apply 1-2 grams to affected area 3-4 times daily Qty. 120 gm 3 refills  . THEANINE PO Take 200 mg by mouth.   No facility-administered encounter medications on file as of 01/24/2017.     Activities of Daily Living No flowsheet data found.   Patient Care Team: Copland, Gay Filler, MD as PCP - General (Family Medicine)    Assessment:    Physical assessment deferred to PCP.  Exercise Activities and Dietary recommendations   Diet (meal preparation, eat out, water intake, caffeinated beverages, dairy products, fruits and vegetables): {Desc; diets:16563} Breakfast: Lunch:  Dinner:      Goals    None  Depression Screen No flowsheet data found.   Cognitive Function         There is no immunization history on file for this patient. Screening Tests Health Maintenance  Topic Date Due  . TETANUS/TDAP  12/12/1969  . MAMMOGRAM  01/05/2012  . DEXA SCAN  12/13/2015  . PNA vac Low Risk Adult (1 of 2 - PCV13) 12/13/2015  . INFLUENZA VACCINE  09/21/2016  . Fecal DNA (Cologuard)  01/12/2019  . Hepatitis C Screening  Completed        Plan:   ***   I have personally reviewed and noted the following in the patient's chart:   . Medical and social history . Use of alcohol, tobacco or illicit drugs  . Current medications and supplements . Functional ability and status . Nutritional status . Physical activity . Advanced directives . List of other physicians . Hospitalizations, surgeries,  and ER visits in previous 12 months . Vitals . Screenings to include cognitive, depression, and falls . Referrals and appointments  In addition, I have reviewed and discussed with patient certain preventive protocols, quality metrics, and best practice recommendations. A written personalized care plan for preventive services as well as general preventive health recommendations were provided to patient.     Shela Nevin, [place]  01/20/2017

## 2017-01-24 ENCOUNTER — Ambulatory Visit: Payer: PPO | Admitting: *Deleted

## 2017-01-27 NOTE — Progress Notes (Deleted)
Subjective:   Ruth Vaughn is a 67 y.o. female who presents for Medicare Annual (Subsequent) preventive examination.  Review of Systems:  No ROS.  Medicare Wellness Visit. Additional risk factors are reflected in the social history.   Sleep patterns:  Home Safety/Smoke Alarms: Feels safe in home. Smoke alarms in place.   Female:   Pap-       Mammo-       Dexa scan-        CCS- Objective:     Vitals: There were no vitals taken for this visit.  There is no height or weight on file to calculate BMI.  Advanced Directives 08/30/2016  Does Patient Have a Medical Advance Directive? Yes  Type of Paramedic of [place];Living will    Tobacco Social History   Tobacco Use  Smoking Status Former Smoker  Smokeless Tobacco Never Used     Counseling given: Not Answered   Clinical Intake:      Past Medical History:  Diagnosis Date  . Arthritis   . Cataract   . Fibromyalgia   . Glaucoma   . Osteoporosis    Past Surgical History:  Procedure Laterality Date  . CERVICAL SPINE SURGERY    . CESAREAN SECTION    . EYE SURGERY    . SPINE SURGERY     No family history on file. Social History   Socioeconomic History  . Marital status: Divorced    Spouse name: Not on file  . Number of children: Not on file  . Years of education: Not on file  . Highest education level: Not on file  Social Needs  . Financial resource strain: Not on file  . Food insecurity - worry: Not on file  . Food insecurity - inability: Not on file  . Transportation needs - medical: Not on file  . Transportation needs - non-medical: Not on file  Occupational History  . Not on file  Tobacco Use  . Smoking status: Former Research scientist (life sciences)  . Smokeless tobacco: Never Used  Substance and Sexual Activity  . Alcohol use: Yes    Comment: daily  . Drug use: No  . Sexual activity: Not on file  Other Topics Concern  . Not on file  Social History Narrative  . Not on file     Outpatient Encounter Medications as of 01/31/2017  Medication Sig  . b complex vitamins tablet Take 1 tablet by mouth daily.  . Cholecalciferol (VITAMIN D PO) Take by mouth.  . cyanocobalamin (,VITAMIN B-12,) 1000 MCG/ML injection Inject 1,000 mcg into the muscle once.  . Fish Oil OIL by Does not apply route.  Marland Kitchen ketoconazole (NIZORAL) 2 % cream Apply 1 application topically 2 (two) times daily.  . NON FORMULARY [facility]  Urea Cream - 40% Urea Apply 1-2 grams to affected area 3-4 times daily Qty. 120 gm 3 refills  . THEANINE PO Take 200 mg by mouth.   No facility-administered encounter medications on file as of 01/31/2017.     Activities of Daily Living No flowsheet data found.  Patient Care Team: Copland, Gay Filler, MD as PCP - General (Family Medicine)    Assessment:    Physical assessment deferred to PCP.  Exercise Activities and Dietary recommendations   Diet (meal preparation, eat out, water intake, caffeinated beverages, dairy products, fruits and vegetables): {Desc; diets:16563} Breakfast: Lunch:  Dinner:      Goals    None     Depression Screen No flowsheet  data found.   Cognitive Function         There is no immunization history on file for this patient. Screening Tests Health Maintenance  Topic Date Due  . TETANUS/TDAP  12/12/1969  . MAMMOGRAM  01/05/2012  . DEXA SCAN  12/13/2015  . PNA vac Low Risk Adult (1 of 2 - PCV13) 12/13/2015  . INFLUENZA VACCINE  09/21/2016  . Fecal DNA (Cologuard)  01/12/2019  . Hepatitis C Screening  Completed      Plan:   ***   I have personally reviewed and noted the following in the patient's chart:   . Medical and social history . Use of alcohol, tobacco or illicit drugs  . Current medications and supplements . Functional ability and status . Nutritional status . Physical activity . Advanced directives . List of other physicians . Hospitalizations, surgeries, and ER visits in previous 12  months . Vitals . Screenings to include cognitive, depression, and falls . Referrals and appointments  In addition, I have reviewed and discussed with patient certain preventive protocols, quality metrics, and best practice recommendations. A written personalized care plan for preventive services as well as general preventive health recommendations were provided to patient.     Shela Nevin, [place]  01/27/2017

## 2017-01-29 ENCOUNTER — Other Ambulatory Visit: Payer: Self-pay | Admitting: Podiatry

## 2017-01-31 ENCOUNTER — Ambulatory Visit: Payer: PPO | Admitting: *Deleted

## 2017-03-06 DIAGNOSIS — H401121 Primary open-angle glaucoma, left eye, mild stage: Secondary | ICD-10-CM | POA: Diagnosis not present

## 2017-03-06 DIAGNOSIS — H26491 Other secondary cataract, right eye: Secondary | ICD-10-CM | POA: Diagnosis not present

## 2017-03-06 DIAGNOSIS — H43811 Vitreous degeneration, right eye: Secondary | ICD-10-CM | POA: Diagnosis not present

## 2017-03-06 DIAGNOSIS — H401131 Primary open-angle glaucoma, bilateral, mild stage: Secondary | ICD-10-CM | POA: Diagnosis not present

## 2017-03-06 DIAGNOSIS — H401111 Primary open-angle glaucoma, right eye, mild stage: Secondary | ICD-10-CM | POA: Diagnosis not present
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
## 2017-06-14 ENCOUNTER — Ambulatory Visit (INDEPENDENT_AMBULATORY_CARE_PROVIDER_SITE_OTHER): Payer: PPO | Admitting: Family Medicine

## 2017-06-14 ENCOUNTER — Encounter: Payer: Self-pay | Admitting: Family Medicine

## 2017-06-14 VITALS — BP 104/72 | HR 75 | Temp 97.6°F | Ht 59.0 in | Wt 119.0 lb

## 2017-06-14 DIAGNOSIS — N898 Other specified noninflammatory disorders of vagina: Secondary | ICD-10-CM

## 2017-06-14 DIAGNOSIS — M25511 Pain in right shoulder: Secondary | ICD-10-CM | POA: Diagnosis not present

## 2017-06-14 DIAGNOSIS — G8929 Other chronic pain: Secondary | ICD-10-CM

## 2017-06-14 MED ORDER — ESTRADIOL 10 MCG VA TABS: ORAL_TABLET | VAGINAL | 11 refills | Status: DC

## 2017-06-14 NOTE — Patient Instructions (Addendum)
It was very good to see you today- please let me know if you need a sports med referral for your shoulder issue I would like to use a vaginal estrogen tablet for you as this allows [place] to more precisely control your dose.  In women who have a uterus, we have to be aware of the risk of endometrial hyperplasia and possibly cancer in using estrogen without periodic progesterone.   Using the very low dose estrogen tablets helps to avoid this risk.  However, please let me know if the tablets are not working as well for you  Take care!

## 2017-06-22 ENCOUNTER — Encounter: Payer: Self-pay | Admitting: Family Medicine

## 2017-06-22 ENCOUNTER — Ambulatory Visit (INDEPENDENT_AMBULATORY_CARE_PROVIDER_SITE_OTHER): Payer: PPO | Admitting: Family Medicine

## 2017-06-22 VITALS — BP 110/70 | HR 93 | Resp 16 | Ht 59.0 in | Wt 117.0 lb

## 2017-06-22 DIAGNOSIS — J0100 Acute maxillary sinusitis, unspecified: Secondary | ICD-10-CM

## 2017-06-22 MED ORDER — DOXYCYCLINE HYCLATE 100 MG PO TABS: 100.0000 mg | ORAL_TABLET | Freq: Two times a day (BID) | ORAL | 0 refills | Status: DC

## 2017-06-22 MED ORDER — FLUTICASONE PROPIONATE 50 MCG/ACT NA SUSP: 2.0000 | Freq: Every day | NASAL | 2 refills | Status: DC

## 2017-06-22 MED ORDER — METHYLPREDNISOLONE ACETATE 80 MG/ML IJ SUSP
80.0000 mg | Freq: Once | INTRAMUSCULAR | Status: AC
  Administered 2017-06-22: 80 mg via INTRAMUSCULAR

## 2017-06-22 NOTE — Progress Notes (Signed)
Chief Complaint  Patient presents with  . Sinusitis    congestion, nasal drip, headache, ear pain, sinus pain, trouble breathing, 100.2 fever    Ruth Vaughn here for URI complaints.  Duration: 8 days  Associated symptoms: Fever (100.2 F), sinus congestion, sinus pain, rhinorrhea, ear pain, sore throat and cough Denies: itchy watery eyes, ear drainage, wheezing, shortness of breath and myalgia Treatment to date: Claritin, Aleve Sick contacts: No- was here just before it started and thinks she got it here  ROS:  Const: +fevers HEENT: As noted in HPI Lungs: No SOB  Past Medical History:  Diagnosis Date  . Arthritis   . Cataract   . Fibromyalgia   . Glaucoma   . Osteoporosis    BP 110/70 (BP Location: Left Arm, Patient Position: Sitting, Cuff Size: Normal)   Pulse 93   Resp 16   Ht 4\' 11"  (1.499 m)   Wt 117 lb (53.1 kg)   SpO2 96%   BMI 23.63 kg/m  General: Awake, alert, appears stated age HEENT: AT, [place], ears patent b/l and TM's neg, nares patent w/o discharge, +ttp over max sinuses b/l; pharynx pink and without exudates, MMM Neck: No masses or asymmetry Heart: RRR Lungs: CTAB, no accessory muscle use Psych: Age appropriate judgment and insight, normal mood and affect  Acute non-recurrent maxillary sinusitis - Plan: doxycycline (VIBRA-TABS) 100 MG tablet, fluticasone (FLONASE) 50 MCG/ACT nasal spray  Orders as above. Ear issue likely 2/2 ETD. Steroid today, nasal steroid for s/s's. Cont Claritin. Continue to push fluids, practice good hand hygiene, cover mouth when coughing. F/u prn. If starting to experience fevers, shaking, or shortness of breath, seek immediate care. Pt voiced understanding and agreement to the plan.  [place], DO 06/22/17 10:36 AM

## 2017-06-22 NOTE — Patient Instructions (Signed)
Use the nasal spray every day on each side, 2 sprays. This will not work right away.  Continue Claritin.  Continue to push fluids, practice good hand hygiene, and cover your mouth if you cough.  If you start having return of fevers, worsening symptoms or shortness of breath, seek immediate care.  Let us know if you need anything.

## 2017-06-22 NOTE — Addendum Note (Signed)
Addended by: Wynonia Musty A on: 06/22/2017 11:36 AM   Modules accepted: Orders

## 2017-07-03 ENCOUNTER — Encounter: Payer: Self-pay | Admitting: Family Medicine

## 2017-07-03 DIAGNOSIS — M25511 Pain in right shoulder: Principal | ICD-10-CM

## 2017-07-03 DIAGNOSIS — G8929 Other chronic pain: Secondary | ICD-10-CM

## 2017-07-10 ENCOUNTER — Encounter: Payer: Self-pay | Admitting: Family Medicine

## 2017-07-10 ENCOUNTER — Ambulatory Visit: Payer: PPO | Admitting: Family Medicine

## 2017-07-10 DIAGNOSIS — M25511 Pain in right shoulder: Secondary | ICD-10-CM

## 2017-07-10 NOTE — Patient Instructions (Signed)
You have an overuse strain of your trapezius and rhomboid muscles. Heat 15 minutes up to 3-4 times a day as needed. Continue with the massages. Do home exercises as directed 3 sets of 10 once a day - with stretches hold for 20-30 seconds, repeat 3 times. Consider physical therapy if you're struggling. Tylenol, ibuprofen only if needed. Follow up with me in 66month for reevaluation.

## 2017-07-11 ENCOUNTER — Encounter: Payer: Self-pay | Admitting: Family Medicine

## 2017-07-11 DIAGNOSIS — M25511 Pain in right shoulder: Secondary | ICD-10-CM | POA: Insufficient documentation

## 2017-07-11 NOTE — Assessment & Plan Note (Signed)
2/2 overuse strain of trapezius and rhomboid muscles.  Heat, massage.  Shown home exercises and stretches to do daily.  Tylenol, ibuprofen if needed.  Consider physical therapy if not improving.  F/u in 1 month.

## 2017-07-11 NOTE — Progress Notes (Signed)
PCP and consultation requested by: Copland, Gay Filler, MD  Subjective:   HPI: Patient is a 67 y.o. female here for right shoulder pain.  Patient reports for about 3-4 months she's had posterior right shoulder pain. Her pain level is 4/10 and sharp, worse with neck and shoulder motions. She initially thought it was due to her chronic neck issues or fibromyalgia. Worse with sewing, vacuuming, sometimes with walking. Wakes her up at times. No numbness or tingling. No radiation into right arm. Right handed. No skin changes, numbness.  Past Medical History:  Diagnosis Date  . Arthritis   . Cataract   . Fibromyalgia   . Glaucoma   . Osteoporosis     Current Outpatient Medications on File Prior to Visit  Medication Sig Dispense Refill  . Cholecalciferol (VITAMIN D PO) Take by mouth.    . doxycycline (VIBRA-TABS) 100 MG tablet Take 1 tablet (100 mg total) by mouth 2 (two) times daily. 20 tablet 0  . Estradiol 10 MCG TABS vaginal tablet Place 1 tablet vaginally twice a week 8 tablet 11  . Fish Oil OIL by Does not apply route.    . fluticasone (FLONASE) 50 MCG/ACT nasal spray Place 2 sprays into both nostrils daily. 16 g 2  . ketoconazole (NIZORAL) 2 % cream APPLY TO THE AFFECTED AREA(S) TWICE DAILY 15 g 1  . magnesium gluconate (MAGONATE) 500 MG tablet Take 500 mg by mouth daily.    . NON FORMULARY [facility]  Urea Cream - 40% Urea Apply 1-2 grams to affected area 3-4 times daily Qty. 120 gm 3 refills    . OVER THE COUNTER MEDICATION Theanine Serence with Relora (Calm Mind & Body): Take one tablet by mouth daily.    Marland Kitchen OVER THE COUNTER MEDICATION Curcum 2250 mg: Take 1 capsule daily    . THEANINE PO Take 200 mg by mouth.     No current facility-administered medications on file prior to visit.     Past Surgical History:  Procedure Laterality Date  . CERVICAL SPINE SURGERY    . CESAREAN SECTION    . EYE SURGERY    . SPINE SURGERY      Allergies  Allergen Reactions   . Codeine Swelling  . Erythromycin   . Penicillins Rash  . Sulfa Antibiotics Swelling and Rash    Social History   Socioeconomic History  . Marital status: Divorced    Spouse name: Not on file  . Number of children: Not on file  . Years of education: Not on file  . Highest education level: Not on file  Occupational History  . Not on file  Social Needs  . Financial resource strain: Not on file  . Food insecurity:    Worry: Not on file    Inability: Not on file  . Transportation needs:    Medical: Not on file    Non-medical: Not on file  Tobacco Use  . Smoking status: Former Research scientist (life sciences)  . Smokeless tobacco: Never Used  Substance and Sexual Activity  . Alcohol use: Yes    Comment: daily  . Drug use: No  . Sexual activity: Not on file  Lifestyle  . Physical activity:    Days per week: Not on file    Minutes per session: Not on file  . Stress: Not on file  Relationships  . Social connections:    Talks on phone: Not on file    Gets together: Not on file    Attends religious service:  Not on file    Active member of club or organization: Not on file    Attends meetings of clubs or organizations: Not on file    Relationship status: Not on file  . Intimate partner violence:    Fear of current or ex partner: Not on file    Emotionally abused: Not on file    Physically abused: Not on file    Forced sexual activity: Not on file  Other Topics Concern  . Not on file  Social History Narrative  . Not on file    History reviewed. No pertinent family history.  BP 117/67   Pulse 72   Ht 4\' 11"  (1.499 m)   Wt 114 lb (51.7 kg)   BMI 23.03 kg/m   Review of Systems: See HPI above.     Objective:  Physical Exam:  Gen: NAD, comfortable in exam room  Neck: No gross deformity, swelling, bruising. TTP inferomedial trapezius, over rhomboids.  No paraspinal or other tenderness.  No midline/bony TTP. FROM with mild pain on flexion, right lateral rotation. BUE strength  5/5. Sensation intact to light touch.   1+ equal reflexes in triceps, biceps, brachioradialis tendons. NV intact distal BUEs.  Right shoulder: No swelling, ecchymoses.  No gross deformity. No TTP. FROM. Negative Hawkins, Neers. Negative Yergasons. Strength 5/5 with empty can and resisted internal/external rotation. Negative apprehension. NV intact distally.   Assessment & Plan:  1. Right shoulder pain - 2/2 overuse strain of trapezius and rhomboid muscles.  Heat, massage.  Shown home exercises and stretches to do daily.  Tylenol, ibuprofen if needed.  Consider physical therapy if not improving.  F/u in 1 month.

## 2017-07-18 ENCOUNTER — Encounter: Payer: Self-pay | Admitting: Family Medicine

## 2017-07-18 DIAGNOSIS — L578 Other skin changes due to chronic exposure to nonionizing radiation: Secondary | ICD-10-CM | POA: Diagnosis not present

## 2017-07-18 DIAGNOSIS — N952 Postmenopausal atrophic vaginitis: Secondary | ICD-10-CM

## 2017-07-18 DIAGNOSIS — B078 Other viral warts: Secondary | ICD-10-CM | POA: Diagnosis not present

## 2017-07-18 MED ORDER — ESTRADIOL 0.1 MG/GM VA CREA: TOPICAL_CREAM | VAGINAL | 5 refills | Status: DC

## 2017-08-07 ENCOUNTER — Ambulatory Visit: Payer: PPO | Admitting: Family Medicine

## 2017-08-07 DIAGNOSIS — H1852 Epithelial (juvenile) corneal dystrophy: Secondary | ICD-10-CM | POA: Diagnosis not present

## 2017-08-07 DIAGNOSIS — H401131 Primary open-angle glaucoma, bilateral, mild stage: Secondary | ICD-10-CM | POA: Diagnosis not present

## 2017-08-07 DIAGNOSIS — H04123 Dry eye syndrome of bilateral lacrimal glands: Secondary | ICD-10-CM | POA: Diagnosis not present

## 2017-08-07 DIAGNOSIS — H16223 Keratoconjunctivitis sicca, not specified as Sjogren's, bilateral: Secondary | ICD-10-CM | POA: Diagnosis not present

## 2017-08-08 ENCOUNTER — Ambulatory Visit: Payer: PPO | Admitting: Family Medicine

## 2017-08-08 ENCOUNTER — Encounter: Payer: Self-pay | Admitting: Family Medicine

## 2017-08-08 DIAGNOSIS — M25511 Pain in right shoulder: Secondary | ICD-10-CM | POA: Diagnosis not present

## 2017-08-08 NOTE — Assessment & Plan Note (Signed)
2/2 overuse strain of trapezius and rhomboid muscles.  Start physical therapy.  Heat, continue home exercises and stretches.  Ibuprofen, tylenol if needed.  F/u in 6 weeks.

## 2017-08-08 NOTE — Patient Instructions (Signed)
You have an overuse strain of your trapezius and rhomboid muscles. Heat 15 minutes up to 3-4 times a day as needed. Start [facility] at [facility]. Continue your home exercises and stretches. Tylenol, ibuprofen only if needed. Follow up with me in 6 weeks for reevaluation.

## 2017-08-08 NOTE — Progress Notes (Signed)
PCP and consultation requested by: Copland, Gay Filler, MD  Subjective:   HPI: Patient is a 67 y.o. female here for right shoulder pain.  5/20: Patient reports for about 3-4 months she's had posterior right shoulder pain. Her pain level is 4/10 and sharp, worse with neck and shoulder motions. She initially thought it was due to her chronic neck issues or fibromyalgia. Worse with sewing, vacuuming, sometimes with walking. Wakes her up at times. No numbness or tingling. No radiation into right arm. Right handed. No skin changes, numbness.  6/18: Patient reports mild improvement since last visit. Pain level 3/10 now still posterior right shoulder. No radiation. No numbness, tingling. Occasionally taking 2 tabs of her herbal supplement that has curcumin and ginger in it.  Past Medical History:  Diagnosis Date  . Arthritis   . Cataract   . Fibromyalgia   . Glaucoma   . Osteoporosis     Current Outpatient Medications on File Prior to Visit  Medication Sig Dispense Refill  . Cholecalciferol (VITAMIN D PO) Take by mouth.    . doxycycline (VIBRA-TABS) 100 MG tablet Take 1 tablet (100 mg total) by mouth 2 (two) times daily. 20 tablet 0  . estradiol (ESTRACE) 0.1 MG/GM vaginal cream Use 0.5gram cream vaginally 1-3x a week as needed 42.5 g 5  . Fish Oil OIL by Does not apply route.    . fluticasone (FLONASE) 50 MCG/ACT nasal spray Place 2 sprays into both nostrils daily. 16 g 2  . ketoconazole (NIZORAL) 2 % cream APPLY TO THE AFFECTED AREA(S) TWICE DAILY 15 g 1  . magnesium gluconate (MAGONATE) 500 MG tablet Take 500 mg by mouth daily.    . NON FORMULARY [facility]  Urea Cream - 40% Urea Apply 1-2 grams to affected area 3-4 times daily Qty. 120 gm 3 refills    . OVER THE COUNTER MEDICATION Theanine Serence with Relora (Calm Mind & Body): Take one tablet by mouth daily.    Marland Kitchen OVER THE COUNTER MEDICATION Curcum 2250 mg: Take 1 capsule daily    . THEANINE PO Take 200 mg by  mouth.     No current facility-administered medications on file prior to visit.     Past Surgical History:  Procedure Laterality Date  . CERVICAL SPINE SURGERY    . CESAREAN SECTION    . EYE SURGERY    . SPINE SURGERY      Allergies  Allergen Reactions  . Codeine Swelling  . Erythromycin   . Penicillins Rash  . Sulfa Antibiotics Swelling and Rash    Social History   Socioeconomic History  . Marital status: Divorced    Spouse name: Not on file  . Number of children: Not on file  . Years of education: Not on file  . Highest education level: Not on file  Occupational History  . Not on file  Social Needs  . Financial resource strain: Not on file  . Food insecurity:    Worry: Not on file    Inability: Not on file  . Transportation needs:    Medical: Not on file    Non-medical: Not on file  Tobacco Use  . Smoking status: Former Research scientist (life sciences)  . Smokeless tobacco: Never Used  Substance and Sexual Activity  . Alcohol use: Yes    Comment: daily  . Drug use: No  . Sexual activity: Not on file  Lifestyle  . Physical activity:    Days per week: Not on file    Minutes  per session: Not on file  . Stress: Not on file  Relationships  . Social connections:    Talks on phone: Not on file    Gets together: Not on file    Attends religious service: Not on file    Active member of club or organization: Not on file    Attends meetings of clubs or organizations: Not on file    Relationship status: Not on file  . Intimate partner violence:    Fear of current or ex partner: Not on file    Emotionally abused: Not on file    Physically abused: Not on file    Forced sexual activity: Not on file  Other Topics Concern  . Not on file  Social History Narrative  . Not on file    History reviewed. No pertinent family history.  BP 115/73   Pulse 80   Ht 4\' 11"  (1.499 m)   Wt 116 lb (52.6 kg)   BMI 23.43 kg/m   Review of Systems: See HPI above.     Objective:  Physical  Exam:  Gen: NAD, comfortable in exam room  Neck: No gross deformity, swelling, bruising. TTP mildly inferomedial trapezius, rhomboids on right side only.  No midline/bony TTP. FROM without pain. BUE strength 5/5.   Sensation intact to light touch.   NV intact distal BUEs.  Right shoulder: No swelling, ecchymoses.  No gross deformity. No TTP. FROM. Strength 5/5 with empty can and resisted internal/external rotation. NV intact distally.   Assessment & Plan:  1. Right shoulder pain - 2/2 overuse strain of trapezius and rhomboid muscles.  Start physical therapy.  Heat, continue home exercises and stretches.  Ibuprofen, tylenol if needed.  F/u in 6 weeks.

## 2017-09-19 ENCOUNTER — Ambulatory Visit: Payer: PPO | Admitting: Family Medicine

## 2017-12-26 NOTE — Progress Notes (Deleted)
[place] at [facility] [address], [place], [place] [address] [phone]  Date:  12/28/2017   Name:  Ruth Vaughn   DOB:  [birth year]   MRN:  [id number]  PCP:  Darreld Mclean, MD    Chief Complaint: No chief complaint on file.   History of Present Illness:  Ruth Vaughn is a 67 y.o. very pleasant female patient who presents with the following:  Here today with concern of possible sinus infection Generally in good health otherwise   Patient Active Problem List   Diagnosis Date Noted  . Right shoulder pain 07/11/2017  . Vitamin D deficiency 01/10/2017  . Vitamin B12 deficiency 07/25/2016  . Depression, recurrent ([place]) 07/25/2016    Past Medical History:  Diagnosis Date  . Arthritis   . Cataract   . Fibromyalgia   . Glaucoma   . Osteoporosis     Past Surgical History:  Procedure Laterality Date  . CERVICAL SPINE SURGERY    . CESAREAN SECTION    . EYE SURGERY    . SPINE SURGERY      Social History   Tobacco Use  . Smoking status: Former Research scientist (life sciences)  . Smokeless tobacco: Never Used  Substance Use Topics  . Alcohol use: Yes    Comment: daily  . Drug use: No    No family history on file.  Allergies  Allergen Reactions  . Codeine Swelling  . Erythromycin   . Penicillins Rash  . Sulfa Antibiotics Swelling and Rash    Medication list has been reviewed and updated.  Current Outpatient Medications on File Prior to Visit  Medication Sig Dispense Refill  . Cholecalciferol (VITAMIN D PO) Take by mouth.    . doxycycline (VIBRA-TABS) 100 MG tablet Take 1 tablet (100 mg total) by mouth 2 (two) times daily. 20 tablet 0  . estradiol (ESTRACE) 0.1 MG/GM vaginal cream Use 0.5gram cream vaginally 1-3x a week as needed 42.5 g 5  . Fish Oil OIL by Does not apply route.    . fluticasone (FLONASE) 50 MCG/ACT nasal spray Place 2 sprays into both nostrils daily. 16 g 2  . ketoconazole (NIZORAL) 2 % cream APPLY  TO THE AFFECTED AREA(S) TWICE DAILY 15 g 1  . magnesium gluconate (MAGONATE) 500 MG tablet Take 500 mg by mouth daily.    . NON FORMULARY [facility]  Urea Cream - 40% Urea Apply 1-2 grams to affected area 3-4 times daily Qty. 120 gm 3 refills    . OVER THE COUNTER MEDICATION Theanine Serence with Relora (Calm Mind & Body): Take one tablet by mouth daily.    Marland Kitchen OVER THE COUNTER MEDICATION Curcum 2250 mg: Take 1 capsule daily    . THEANINE PO Take 200 mg by mouth.     No current facility-administered medications on file prior to visit.     Review of Systems:  As per HPI- otherwise negative.   Physical Examination: There were no vitals filed for this visit. There were no vitals filed for this visit. There is no height or weight on file to calculate BMI. Ideal Body Weight:    GEN: WDWN, NAD, Non-toxic, A & O x 3 HEENT: Atraumatic, Normocephalic. Neck supple. No masses, No LAD. Ears and Nose: No external deformity. CV: RRR, No M/G/R. No JVD. No thrill. No extra heart sounds. PULM: CTA B, no wheezes, crackles, rhonchi. No retractions. No resp. distress. No accessory muscle use. ABD: S,  NT, ND, +BS. No rebound. No HSM. EXTR: No c/c/e NEURO Normal gait.  PSYCH: Normally interactive. Conversant. Not depressed or anxious appearing.  Calm demeanor.    Assessment and Plan: ***  Signed Lamar Blinks, MD

## 2017-12-28 ENCOUNTER — Ambulatory Visit (INDEPENDENT_AMBULATORY_CARE_PROVIDER_SITE_OTHER): Payer: PPO | Admitting: Family Medicine

## 2017-12-28 ENCOUNTER — Ambulatory Visit: Payer: PPO | Admitting: Family Medicine

## 2017-12-28 ENCOUNTER — Encounter: Payer: Self-pay | Admitting: Family Medicine

## 2017-12-28 VITALS — BP 128/80 | HR 78 | Temp 98.0°F | Resp 16 | Ht 59.0 in | Wt 117.4 lb

## 2017-12-28 DIAGNOSIS — J0110 Acute frontal sinusitis, unspecified: Secondary | ICD-10-CM | POA: Diagnosis not present

## 2017-12-28 MED ORDER — DOXYCYCLINE HYCLATE 100 MG PO CAPS: 100.0000 mg | ORAL_CAPSULE | Freq: Two times a day (BID) | ORAL | 0 refills | Status: DC

## 2017-12-28 NOTE — Progress Notes (Signed)
[place] at [facility] [place], [place], [place] [address] [phone]  Date:  12/28/2017   Name:  Ruth Vaughn   DOB:  [birth year]   MRN:  [id number]  PCP:  Darreld Mclean, MD    Chief Complaint: Sinusitis (6 weeks, headache, ear pain, toothache, congestion,tried flonase and claritin  )   History of Present Illness:  Ruth Vaughn is a 67 y.o. very pleasant female patient who presents with the following:  Last seen by myself in April for vaginal dryness  Her cat died last night- pt had to reschedule her appt this afternoon The cat died peacefully in her sleep which is a blessing  She thinks that she has a sinus infection today She was recently doing some refinishing work on her kitchen back in September - started on about the 20th.  She hat noted sx of sinus congestion at that time.  She went on flonase and otc claritin However her sx have continued She notes pressure and pain in her sinuses Her teeth have been hurting No cough No fever or chills No vomiting  Patient Active Problem List   Diagnosis Date Noted  . Right shoulder pain 07/11/2017  . Vitamin D deficiency 01/10/2017  . Vitamin B12 deficiency 07/25/2016  . Depression, recurrent ([place]) 07/25/2016    Past Medical History:  Diagnosis Date  . Arthritis   . Cataract   . Fibromyalgia   . Glaucoma   . Osteoporosis     Past Surgical History:  Procedure Laterality Date  . CERVICAL SPINE SURGERY    . CESAREAN SECTION    . EYE SURGERY    . SPINE SURGERY      Social History   Tobacco Use  . Smoking status: Former Research scientist (life sciences)  . Smokeless tobacco: Never Used  Substance Use Topics  . Alcohol use: Yes    Comment: daily  . Drug use: No    No family history on file.  Allergies  Allergen Reactions  . Codeine Swelling  . Erythromycin   . Penicillins Rash  . Sulfa Antibiotics Swelling and Rash    Medication list has been reviewed and  updated.  Current Outpatient Medications on File Prior to Visit  Medication Sig Dispense Refill  . Cholecalciferol (VITAMIN D PO) Take by mouth.    . estradiol (ESTRACE) 0.1 MG/GM vaginal cream Use 0.5gram cream vaginally 1-3x a week as needed 42.5 g 5  . Fish Oil OIL by Does not apply route.    . fluticasone (FLONASE) 50 MCG/ACT nasal spray Place 2 sprays into both nostrils daily. 16 g 2  . OVER THE COUNTER MEDICATION Theanine Serence with Relora (Calm Mind & Body): Take one tablet by mouth daily.    Marland Kitchen OVER THE COUNTER MEDICATION Curcum 2250 mg: Take 1 capsule daily     No current facility-administered medications on file prior to visit.     Review of Systems: No CP or SOB As per HPI- otherwise negative.   Physical Examination: Vitals:   12/28/17 1436  BP: 128/80  Pulse: 78  Resp: 16  Temp: 98 F (36.7 C)  SpO2: 98%   Vitals:   12/28/17 1436  Weight: 117 lb 6.4 oz (53.3 kg)  Height: 4\' 11"  (1.499 m)   Body mass index is 23.71 kg/m. Ideal Body Weight: Weight in (lb) to have BMI = 25: 123.5  GEN: WDWN, NAD, Non-toxic, A & O x 3, looks well,  normal weight  HEENT: Atraumatic, Normocephalic. Neck supple. No masses, No LAD.. Bilateral TM wnl, oropharynx normal.  PEERL,EOMI.   Nasal cavity is congested.  She has frontal sinus TTp  Ears and Nose: No external deformity. CV: RRR, No M/G/R. No JVD. No thrill. No extra heart sounds. PULM: CTA B, no wheezes, crackles, rhonchi. No retractions. No resp. distress. No accessory muscle use. EXTR: No c/c/e NEURO Normal gait.  PSYCH: Normally interactive. Conversant. Not depressed or anxious appearing.  Calm demeanor.    Assessment and Plan: Acute non-recurrent frontal sinusitis - Plan: doxycycline (VIBRAMYCIN) 100 MG capsule  Sinusitis- treat with doxycycline today She will let me know if not feeling better in the next few days- Sooner if worse.     Signed Lamar Blinks, MD

## 2017-12-28 NOTE — Patient Instructions (Signed)
It was good to see you today, but I am so sorry for the loss of your cat!   We are going to use doxycycline twice a day for 10 days- let me know if not helpful

## 2018-02-19 DIAGNOSIS — H26491 Other secondary cataract, right eye: Secondary | ICD-10-CM | POA: Diagnosis not present

## 2018-02-19 DIAGNOSIS — Z961 Presence of intraocular lens: Secondary | ICD-10-CM | POA: Diagnosis not present

## 2018-02-19 DIAGNOSIS — H401131 Primary open-angle glaucoma, bilateral, mild stage: Secondary | ICD-10-CM | POA: Diagnosis not present

## 2018-02-19 DIAGNOSIS — H04123 Dry eye syndrome of bilateral lacrimal glands: Secondary | ICD-10-CM | POA: Diagnosis not present

## 2018-05-11 ENCOUNTER — Ambulatory Visit: Payer: PPO | Admitting: *Deleted

## 2018-05-22 ENCOUNTER — Other Ambulatory Visit: Payer: Self-pay | Admitting: Family Medicine

## 2018-05-22 DIAGNOSIS — J0100 Acute maxillary sinusitis, unspecified: Secondary | ICD-10-CM

## 2018-05-22 MED ORDER — FLUTICASONE PROPIONATE 50 MCG/ACT NA SUSP: 2.0000 | Freq: Every day | NASAL | 2 refills | Status: DC

## 2018-09-24 ENCOUNTER — Other Ambulatory Visit: Payer: Self-pay

## 2018-10-01 IMAGING — CR DG CHEST 2V
2 series · 2 of 2 positions shown · non-contrast
Comparison: None.

CLINICAL DATA: Fatigue for several months, chest pain,

EXAM:
CHEST  2 VIEW

[w chest pa]
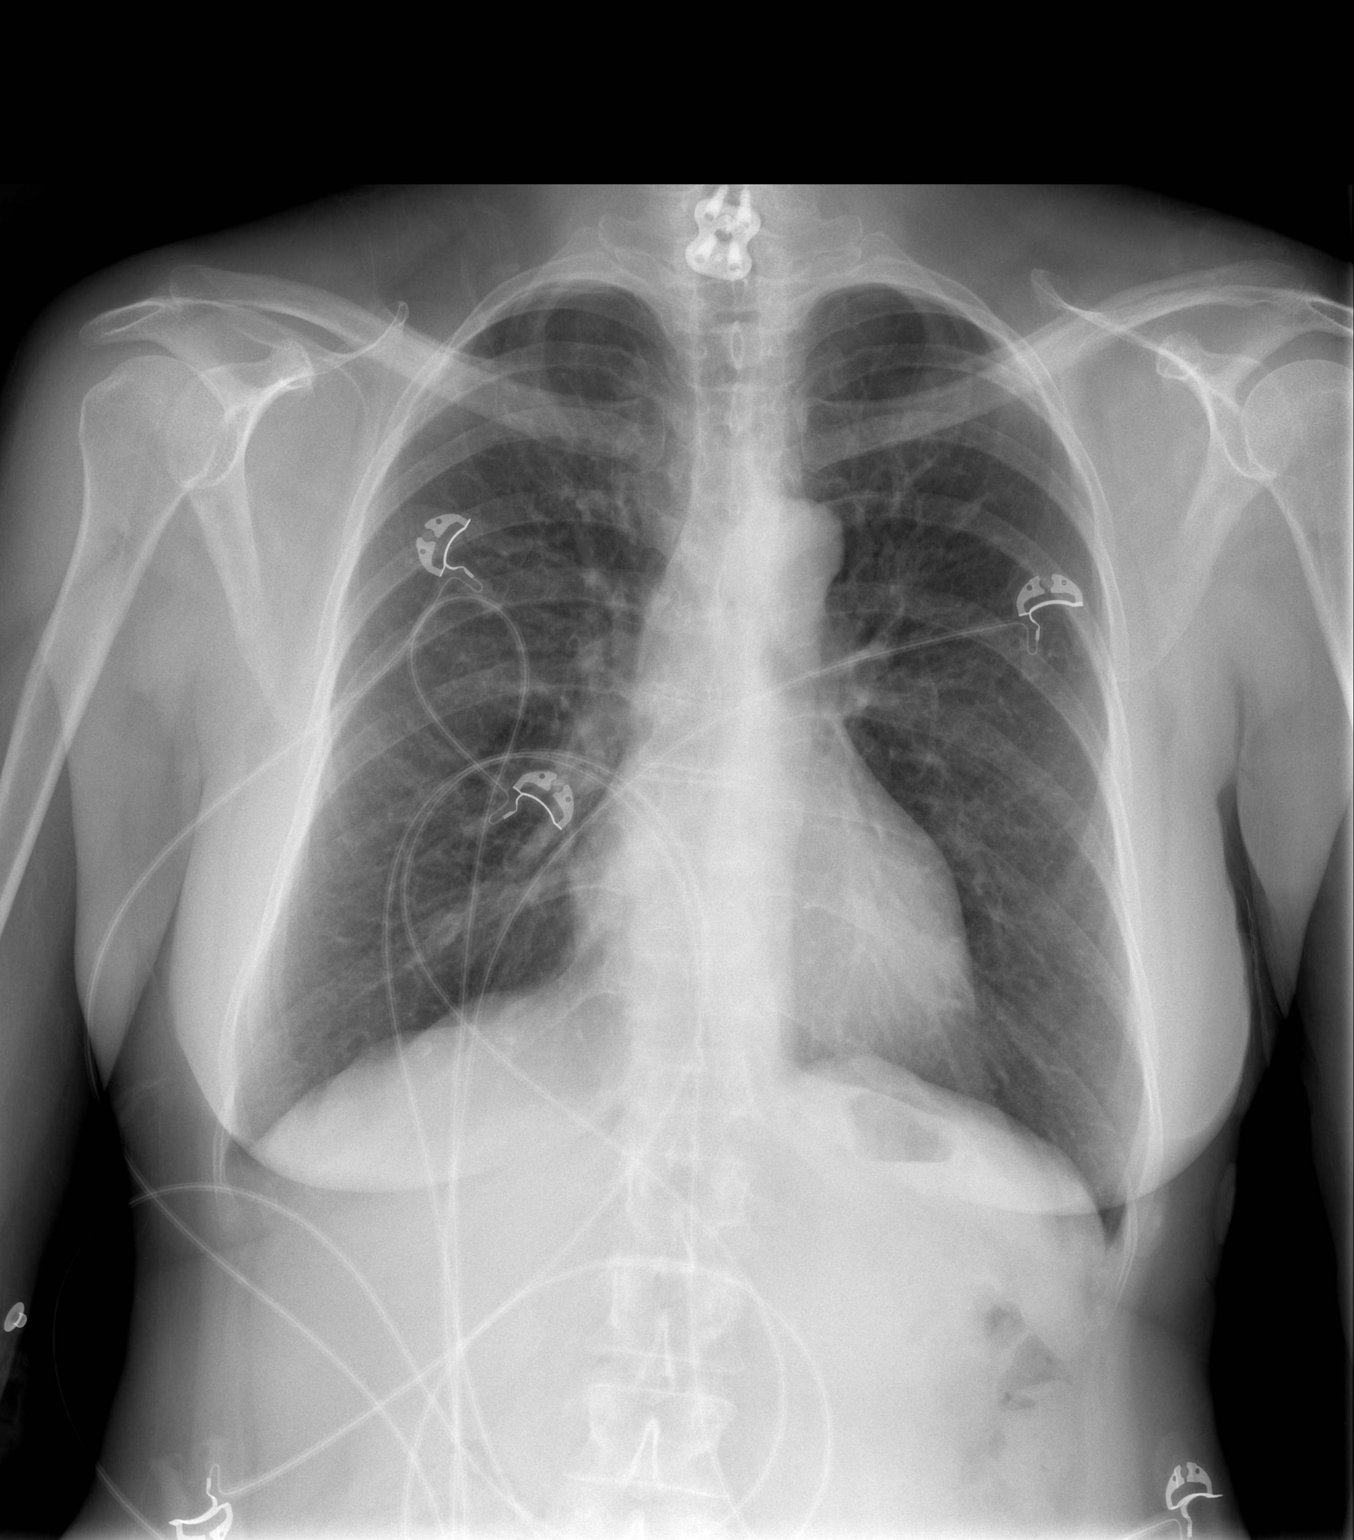

[w chest lat]
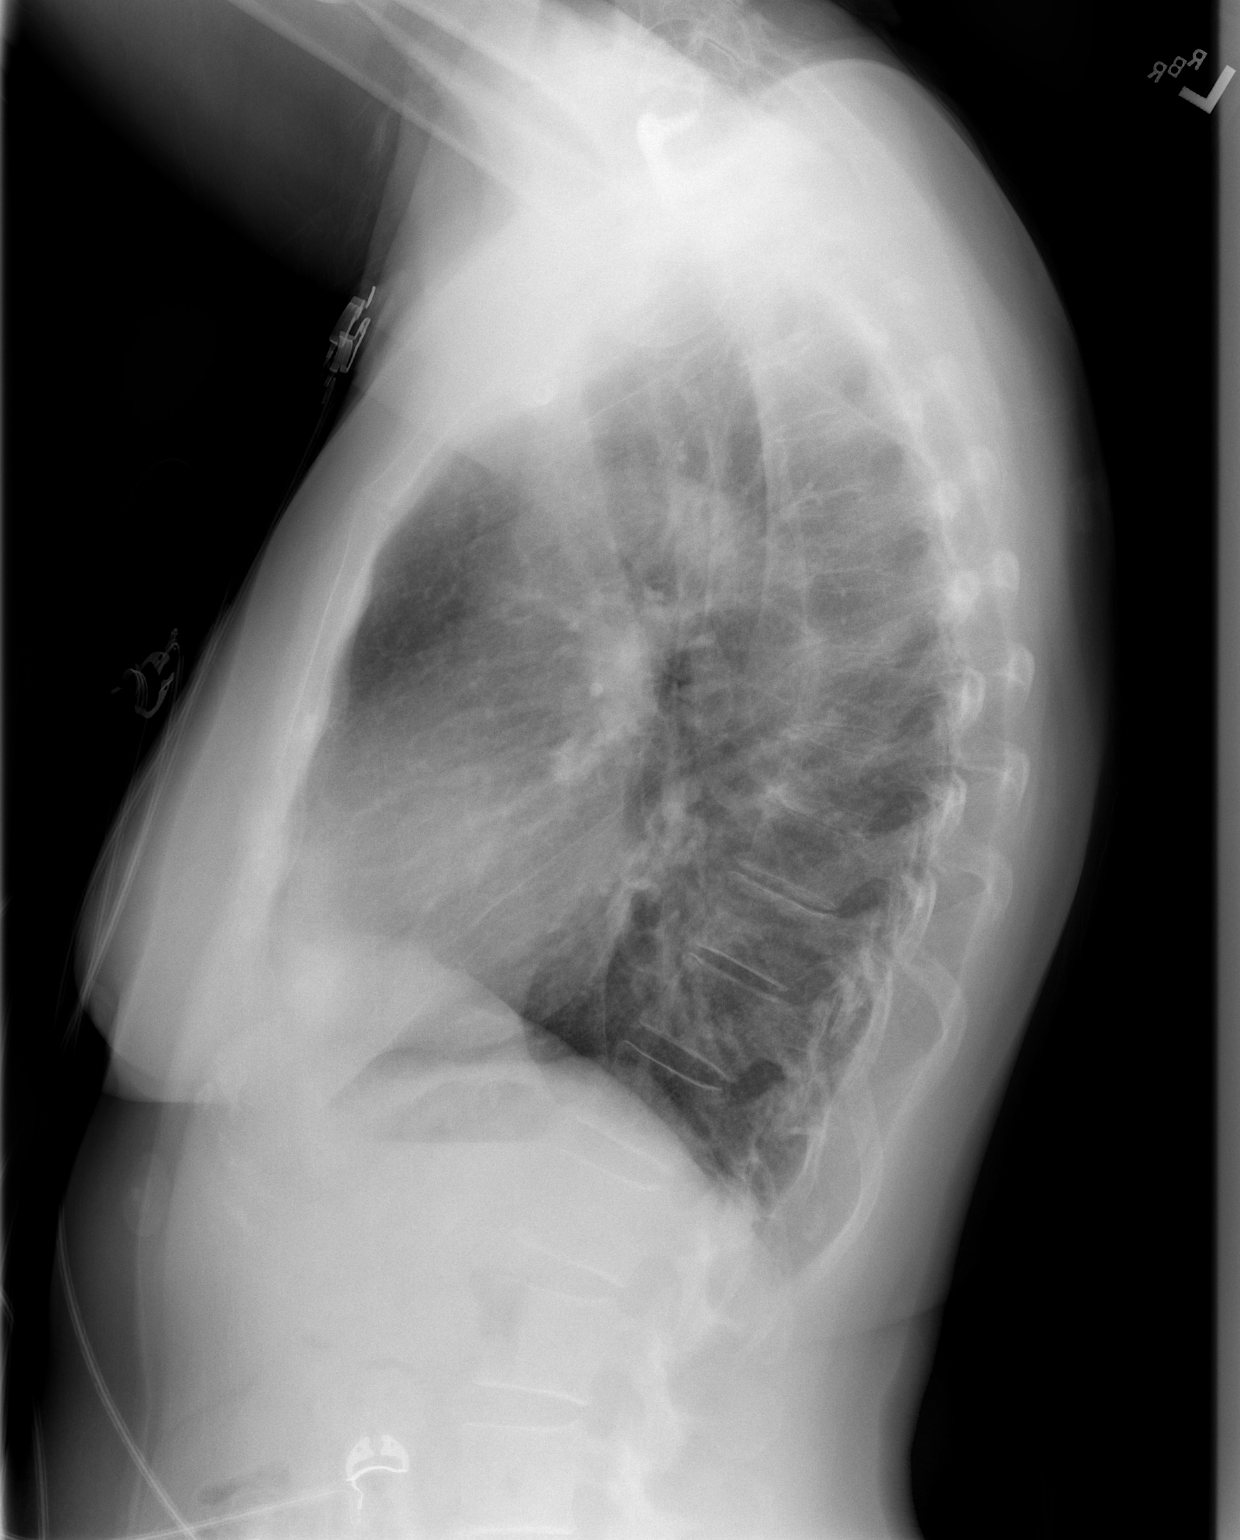

[2 of 2 positions shown; findings below may reference images not displayed]

FINDINGS: No active infiltrate or effusion is seen. Mediastinal and hilar
contours are unremarkable. On the lateral view of the chest there is
a vague nodular opacity posteriorly which may simply be due to
overlapping bony and vascular structures, but attention to this area
on followup chest x-ray is recommended. The heart is within normal
limits in size. No acute bony abnormality is seen.
IMPRESSION: 1. No active cardiopulmonary disease.
2. Vague nodular opacity seen only on the lateral view. Possible
superimposed vascular and bony structures, but attention to this
area on followup chest x-ray is recommended.

## 2019-01-23 ENCOUNTER — Other Ambulatory Visit: Payer: Self-pay | Admitting: Family Medicine

## 2019-01-23 DIAGNOSIS — Z1231 Encounter for screening mammogram for malignant neoplasm of breast: Secondary | ICD-10-CM

## 2019-03-11 ENCOUNTER — Encounter: Payer: Self-pay | Admitting: Family Medicine

## 2019-03-11 NOTE — Telephone Encounter (Signed)
I have sent the patient information regarding the vaccine- Could you please advise on her concern?

## 2019-03-12 NOTE — Telephone Encounter (Signed)
Replied to her Ruth Vaughn can we please order her a Cologuard kit.  Thank you!

## 2019-04-01 ENCOUNTER — Encounter: Payer: Self-pay | Admitting: Family Medicine

## 2019-04-01 DIAGNOSIS — N952 Postmenopausal atrophic vaginitis: Secondary | ICD-10-CM

## 2019-04-02 ENCOUNTER — Other Ambulatory Visit: Payer: Self-pay

## 2019-04-02 NOTE — Progress Notes (Addendum)
[place] at [facility] [place], [place], [place] [address] [phone]  Date:  04/03/2019   Name:  Ruth Vaughn   DOB:  September 03, 1950   MRN:  [id number]  PCP:  Darreld Mclean, MD    Chief Complaint: Medication Refill (estreace refill), Hand Pain (left hand pain), and Rash (on back, itching)   History of Present Illness:  Ruth Vaughn is a 69 y.o. very pleasant female patient who presents with the following:  Here today for follow-up visit Patient with history of vitamin D and vitamin B12 deficiency, glaucoma, osteoporosis, fibromyalgia, depression, vaginal atrophy She needs a RF of her estrace cream  She uses this about 2-3x a week  Her bladder sx and vaginal dryness are improved  She has a new female friend, and is currently sexually active again-the vaginal estrogen helps  Last seen by myself in November 2019  Mammogram due- will order Bone density due- will order  Flu shot- pt declines  Due for Cologuard repeat- she just completed kit, result pending  Most recent labs 2018 She had her first covid shot already, next dose scheduled   She has noted a tender bump at the base of her left thumb for some weeks, no known injury.  She thinks this may be arthritis  She has some itching on her back, has noticed for the last few weeks  Patient Active Problem List   Diagnosis Date Noted  . Right shoulder pain 07/11/2017  . Vitamin D deficiency 01/10/2017  . Vitamin B12 deficiency 07/25/2016  . Depression, recurrent ([place]) 07/25/2016    Past Medical History:  Diagnosis Date  . Arthritis   . Cataract   . Fibromyalgia   . Glaucoma   . Osteoporosis     Past Surgical History:  Procedure Laterality Date  . CERVICAL SPINE SURGERY    . CESAREAN SECTION    . EYE SURGERY    . SPINE SURGERY      Social History   Tobacco Use  . Smoking status: Former Research scientist (life sciences)  . Smokeless tobacco: Never Used  Substance Use  Topics  . Alcohol use: Yes    Comment: daily  . Drug use: No    History reviewed. No pertinent family history.  Allergies  Allergen Reactions  . Codeine Swelling  . Erythromycin   . Penicillins Rash  . Povidone Iodine Dermatitis and Rash  . Sulfa Antibiotics Swelling and Rash    Medication list has been reviewed and updated.  Current Outpatient Medications on File Prior to Visit  Medication Sig Dispense Refill  . Cholecalciferol (VITAMIN D PO) Take by mouth.    . estradiol (ESTRACE) 0.1 MG/GM vaginal cream Use 0.5gram cream vaginally 1-3x a week as needed 42.5 g 5  . Fish Oil OIL by Does not apply route.    . fluticasone (FLONASE) 50 MCG/ACT nasal spray Place 2 sprays into both nostrils daily. 16 g 2  . OVER THE COUNTER MEDICATION Curcum 2250 mg: Take 1 capsule daily     No current facility-administered medications on file prior to visit.    Review of Systems:  As per HPI- otherwise negative.  No fever or chills Physical Examination: Vitals:   04/03/19 1045  BP: 122/64  Pulse: 71  Resp: 16  Temp: (!) 96.3 F (35.7 C)  SpO2: 97%   Vitals:   04/03/19 1045  Weight: 118 lb (53.5 kg)  Height: '4\' 11"'  (1.499 m)  Body mass index is 23.83 kg/m. Ideal Body Weight: Weight in (lb) to have BMI = 25: 123.5  GEN: no acute distress.  Normal weight, looks well HEENT: Atraumatic, Normocephalic.  TM within normal notes bilaterally, PERRL Ears and Nose: No external deformity. CV: RRR, No M/G/R. No JVD. No thrill. No extra heart sounds. PULM: CTA B, no wheezes, crackles, rhonchi. No retractions. No resp. distress. No accessory muscle use. ABD: S, NT, ND, +BS. No rebound. No HSM. EXTR: No c/c/e PSYCH: Normally interactive. Conversant.  There is a firm nodule at the base of the left thumb, at the MCP joint.  Size of a large BB.  likely related to osteoarthritis She has eczema on her lower back  Assessment and Plan: Vitamin B12 deficiency - Plan: B12  Vitamin D deficiency  - Plan: Vitamin D (25 hydroxy)  Screening for deficiency anemia - Plan: CBC  Screening for hyperlipidemia - Plan: Lipid panel  Screening for diabetes mellitus - Plan: Comprehensive metabolic panel, Hemoglobin A1c  Screening for hypothyroidism - Plan: TSH  Vaginal atrophy - Plan: estradiol (ESTRACE) 0.1 MG/GM vaginal cream  Encounter for screening mammogram for malignant neoplasm of breast - Plan: MM 3D SCREEN BREAST BILATERAL  Estrogen deficiency - Plan: DG Bone Density  Left hand pain - Plan: DG Hand Complete Left  Here today for routine follow-up visit Ordered labs and screening tests as above Refilled her vaginal estrogen cream Ordered x-ray of her left hand to evaluate nodule Discussed conservative management of eczema Moderate medical decision making today This visit occurred during the SARS-CoV-2 public health emergency.  Safety protocols were in place, including screening questions prior to the visit, additional usage of staff PPE, and extensive cleaning of exam room while observing appropriate contact time as indicated for disinfecting solutions.    Signed Lamar Blinks, MD  Received her films and labs as below, message to patient Results for orders placed or performed in visit on 04/03/19  CBC  Result Value Ref Range   WBC 5.4 4.0 - 10.5 K/uL   RBC 4.85 3.87 - 5.11 Mil/uL   Platelets 257.0 150.0 - 400.0 K/uL   Hemoglobin 15.0 12.0 - 15.0 g/dL   HCT 44.5 36.0 - 46.0 %   MCV 91.9 78.0 - 100.0 fl   MCHC 33.6 30.0 - 36.0 g/dL   RDW 13.8 11.5 - 15.5 %  Comprehensive metabolic panel  Result Value Ref Range   Sodium 139 135 - 145 mEq/L   Potassium 4.1 3.5 - 5.1 mEq/L   Chloride 101 96 - 112 mEq/L   CO2 31 19 - 32 mEq/L   Glucose, Bld 114 (H) 70 - 99 mg/dL   BUN 11 6 - 23 mg/dL   Creatinine, Ser 0.79 0.40 - 1.20 mg/dL   Total Bilirubin 0.6 0.2 - 1.2 mg/dL   Alkaline Phosphatase 52 39 - 117 U/L   AST 18 0 - 37 U/L   ALT 13 0 - 35 U/L   Total Protein 6.9 6.0 -  8.3 g/dL   Albumin 4.2 3.5 - 5.2 g/dL   GFR 72.31 >60.00 mL/min   Calcium 9.5 8.4 - 10.5 mg/dL  Hemoglobin A1c  Result Value Ref Range   Hgb A1c MFr Bld 5.5 4.6 - 6.5 %  Lipid panel  Result Value Ref Range   Cholesterol 233 (H) 0 - 200 mg/dL   Triglycerides 68.0 0.0 - 149.0 mg/dL   HDL 82.40 >39.00 mg/dL   VLDL 13.6 0.0 - 40.0 mg/dL   LDL  Cholesterol 137 (H) 0 - 99 mg/dL   Total CHOL/HDL Ratio 3    NonHDL 150.77   TSH  Result Value Ref Range   TSH 1.48 0.35 - 4.50 uIU/mL  B12  Result Value Ref Range   Vitamin B-12 467 211 - 911 pg/mL  Vitamin D (25 hydroxy)  Result Value Ref Range   VITD 57.98 30.00 - 100.00 ng/mL   DG Hand Complete Left  Result Date: 04/03/2019 CLINICAL DATA:  Pain, swelling in 1st mcp jt of lt hand x75yr EXAM: LEFT HAND - COMPLETE 3+ VIEW COMPARISON:  None. FINDINGS: There is no evidence of fracture or dislocation. There is severe joint space narrowing with sclerosis and subchondral cyst formation at the [facility] of the proximal thumb. There are minimal scattered degenerative changes in the remainder of the right hand and wrist. IMPRESSION: No acute osseous abnormality in the left hand. Severe degenerative changes at the [facility] of the proximal thumb. Electronically Signed   By: NAudie PintoM.D.   On: 04/03/2019 16:12

## 2019-04-03 ENCOUNTER — Encounter: Payer: Self-pay | Admitting: Family Medicine

## 2019-04-03 ENCOUNTER — Ambulatory Visit (INDEPENDENT_AMBULATORY_CARE_PROVIDER_SITE_OTHER): Payer: PPO | Admitting: Family Medicine

## 2019-04-03 ENCOUNTER — Ambulatory Visit (HOSPITAL_BASED_OUTPATIENT_CLINIC_OR_DEPARTMENT_OTHER)
Admission: RE | Admit: 2019-04-03 | Discharge: 2019-04-03 | Disposition: A | Discharge status: Home / Self Care | Payer: PPO | Source: Ambulatory Visit | Attending: Family Medicine | Admitting: Family Medicine

## 2019-04-03 VITALS — BP 122/64 | HR 71 | Temp 96.3°F | Resp 16 | Ht 59.0 in | Wt 118.0 lb

## 2019-04-03 DIAGNOSIS — Z1329 Encounter for screening for other suspected endocrine disorder: Secondary | ICD-10-CM | POA: Diagnosis not present

## 2019-04-03 DIAGNOSIS — E538 Deficiency of other specified B group vitamins: Secondary | ICD-10-CM | POA: Diagnosis not present

## 2019-04-03 DIAGNOSIS — Z131 Encounter for screening for diabetes mellitus: Secondary | ICD-10-CM

## 2019-04-03 DIAGNOSIS — M79642 Pain in left hand: Secondary | ICD-10-CM

## 2019-04-03 DIAGNOSIS — Z1231 Encounter for screening mammogram for malignant neoplasm of breast: Secondary | ICD-10-CM | POA: Diagnosis not present

## 2019-04-03 DIAGNOSIS — Z13220 Encounter for screening for lipoid disorders: Secondary | ICD-10-CM

## 2019-04-03 DIAGNOSIS — M7989 Other specified soft tissue disorders: Secondary | ICD-10-CM | POA: Diagnosis not present

## 2019-04-03 DIAGNOSIS — E559 Vitamin D deficiency, unspecified: Secondary | ICD-10-CM | POA: Diagnosis not present

## 2019-04-03 DIAGNOSIS — M19042 Primary osteoarthritis, left hand: Secondary | ICD-10-CM | POA: Diagnosis not present

## 2019-04-03 DIAGNOSIS — Z130 Encounter for screening for diseases of the blood and blood-forming organs and certain disorders involving the immune mechanism: Secondary | ICD-10-CM

## 2019-04-03 DIAGNOSIS — N952 Postmenopausal atrophic vaginitis: Secondary | ICD-10-CM

## 2019-04-03 DIAGNOSIS — E2839 Other primary ovarian failure: Secondary | ICD-10-CM | POA: Diagnosis not present

## 2019-04-03 LAB — LIPID PANEL
Cholesterol: 233 mg/dL — ABNORMAL HIGH (ref 0–200)
HDL: 82.4 mg/dL (ref >39.00)
LDL Cholesterol: 137 mg/dL — ABNORMAL HIGH (ref 0–99)
NonHDL: 150.77
Total CHOL/HDL Ratio: 3
Triglycerides: 68 mg/dL (ref 0.0–149.0)
VLDL: 13.6 mg/dL (ref 0.0–40.0)

## 2019-04-03 LAB — COMPREHENSIVE METABOLIC PANEL
ALT: 13 U/L (ref 0–35)
AST: 18 U/L (ref 0–37)
Albumin: 4.2 g/dL (ref 3.5–5.2)
Alkaline Phosphatase: 52 U/L (ref 39–117)
BUN: 11 mg/dL (ref 6–23)
CO2: 31 mEq/L (ref 19–32)
Calcium: 9.5 mg/dL (ref 8.4–10.5)
Chloride: 101 mEq/L (ref 96–112)
Creatinine, Ser: 0.79 mg/dL (ref 0.40–1.20)
GFR: 72.31 mL/min (ref >60.00)
Glucose, Bld: 114 mg/dL — ABNORMAL HIGH (ref 70–99)
Potassium: 4.1 mEq/L (ref 3.5–5.1)
Sodium: 139 mEq/L (ref 135–145)
Total Bilirubin: 0.6 mg/dL (ref 0.2–1.2)
Total Protein: 6.9 g/dL (ref 6.0–8.3)

## 2019-04-03 LAB — VITAMIN D 25 HYDROXY (VIT D DEFICIENCY, FRACTURES): VITD: 57.98 ng/mL (ref 30.00–100.00)

## 2019-04-03 LAB — TSH: TSH: 1.48 u[IU]/mL (ref 0.35–4.50)

## 2019-04-03 LAB — CBC
HCT: 44.5 % (ref 36.0–46.0)
Hemoglobin: 15 g/dL (ref 12.0–15.0)
MCHC: 33.6 g/dL (ref 30.0–36.0)
MCV: 91.9 fl (ref 78.0–100.0)
Platelets: 257 10*3/uL (ref 150.0–400.0)
RBC: 4.85 Mil/uL (ref 3.87–5.11)
RDW: 13.8 % (ref 11.5–15.5)
WBC: 5.4 10*3/uL (ref 4.0–10.5)

## 2019-04-03 LAB — HEMOGLOBIN A1C: Hgb A1c MFr Bld: 5.5 % (ref 4.6–6.5)

## 2019-04-03 LAB — VITAMIN B12: Vitamin B-12: 467 pg/mL (ref 211–911)

## 2019-04-03 MED ORDER — ESTRADIOL 0.1 MG/GM VA CREA: TOPICAL_CREAM | VAGINAL | 5 refills | Status: DC

## 2019-04-03 NOTE — Patient Instructions (Addendum)
It was great to see you again today, I will be in touch with your labs as soon as possible Please go to the lab and then to imaging - ask about your mammo/ bone density and get your left wrist film done We will watch for your cologuard report For your hand, I would suggest tylenol/ ibuprofen or topical diclofenac gel as needed

## 2019-04-04 DIAGNOSIS — H26491 Other secondary cataract, right eye: Secondary | ICD-10-CM | POA: Diagnosis not present

## 2019-04-04 DIAGNOSIS — H401131 Primary open-angle glaucoma, bilateral, mild stage: Secondary | ICD-10-CM | POA: Diagnosis not present

## 2019-04-04 DIAGNOSIS — H04123 Dry eye syndrome of bilateral lacrimal glands: Secondary | ICD-10-CM | POA: Diagnosis not present

## 2019-04-04 DIAGNOSIS — H43811 Vitreous degeneration, right eye: Secondary | ICD-10-CM | POA: Diagnosis not present

## 2019-04-11 ENCOUNTER — Other Ambulatory Visit (HOSPITAL_BASED_OUTPATIENT_CLINIC_OR_DEPARTMENT_OTHER): Payer: PPO

## 2019-04-11 ENCOUNTER — Ambulatory Visit (HOSPITAL_BASED_OUTPATIENT_CLINIC_OR_DEPARTMENT_OTHER): Payer: PPO

## 2019-04-16 ENCOUNTER — Other Ambulatory Visit: Payer: Self-pay

## 2019-04-16 ENCOUNTER — Ambulatory Visit (HOSPITAL_BASED_OUTPATIENT_CLINIC_OR_DEPARTMENT_OTHER)
Admission: RE | Admit: 2019-04-16 | Discharge: 2019-04-16 | Disposition: A | Discharge status: Home / Self Care | Payer: PPO | Source: Ambulatory Visit | Attending: Family Medicine | Admitting: Family Medicine

## 2019-04-16 DIAGNOSIS — Z780 Asymptomatic menopausal state: Secondary | ICD-10-CM | POA: Diagnosis not present

## 2019-04-16 DIAGNOSIS — E2839 Other primary ovarian failure: Secondary | ICD-10-CM | POA: Diagnosis not present

## 2019-04-16 DIAGNOSIS — Z1231 Encounter for screening mammogram for malignant neoplasm of breast: Secondary | ICD-10-CM | POA: Insufficient documentation

## 2019-04-16 DIAGNOSIS — M8589 Other specified disorders of bone density and structure, multiple sites: Secondary | ICD-10-CM | POA: Diagnosis not present

## 2019-04-17 ENCOUNTER — Encounter: Payer: Self-pay | Admitting: Family Medicine

## 2019-04-17 DIAGNOSIS — M8580 Other specified disorders of bone density and structure, unspecified site: Secondary | ICD-10-CM | POA: Insufficient documentation

## 2019-05-14 DIAGNOSIS — Z1212 Encounter for screening for malignant neoplasm of rectum: Secondary | ICD-10-CM | POA: Diagnosis not present

## 2019-05-14 DIAGNOSIS — Z1211 Encounter for screening for malignant neoplasm of colon: Secondary | ICD-10-CM | POA: Diagnosis not present

## 2019-05-14 LAB — COLOGUARD: Cologuard: NEGATIVE

## 2019-05-18 LAB — COLOGUARD: COLOGUARD: NEGATIVE

## 2019-05-18 LAB — EXTERNAL GENERIC LAB PROCEDURE: COLOGUARD: NEGATIVE

## 2019-05-22 ENCOUNTER — Encounter: Payer: Self-pay | Admitting: Family Medicine

## 2019-05-27 ENCOUNTER — Encounter: Payer: Self-pay | Admitting: Family Medicine

## 2019-05-31 NOTE — Progress Notes (Signed)
Nurse connected with patient 06/03/19 at 11:00 AM EDT by a telephone enabled telemedicine application and verified that I am speaking with the correct person using two identifiers. Patient stated full name and DOB. Patient gave permission to continue with virtual visit. Patient's location was at home and Nurse's location was at [place] office.   Subjective:   Ruth Vaughn is a 69 y.o. female who presents for an Initial Medicare Annual Wellness Visit. The Patient was informed that the wellness visit is to identify future health risk and educate and initiate measures that can reduce risk for increased disease through the lifespan.   Describes health as fair, good or great? Great  Review of Systems    Home Safety/Smoke Alarms: Feels safe in home. Smoke alarms in place.  Lives alone in 1 story. States she has great neighbors that check on her.  Female:       Mammo-04/16/19       Dexa scan-04/16/19        CCS- Cologuard 03/2019 negative Eye- Dr.Hecker every 6 months   Objective:     Advanced Directives 06/03/2019 08/30/2016  Does Patient Have a Medical Advance Directive? Yes Yes  Type of Paramedic of [place];Living will [place];Living will  Does patient want to make changes to medical advance directive? No - Patient declined -  Copy of [place] in Chart? No - copy requested -    Current Medications (verified) Outpatient Encounter Medications as of 06/03/2019  Medication Sig  . Cholecalciferol (VITAMIN D PO) Take by mouth.  . estradiol (ESTRACE) 0.1 MG/GM vaginal cream Use 0.5gram cream vaginally 1-3x a week as needed  . Fish Oil OIL by Does not apply route.  . fluticasone (FLONASE) 50 MCG/ACT nasal spray Place 2 sprays into both nostrils daily.  Marland Kitchen OVER THE COUNTER MEDICATION Curcum 2250 mg: Take 1 capsule daily   No facility-administered encounter medications on file as of 06/03/2019.    Allergies  (verified) Codeine, Erythromycin, Penicillins, Povidone iodine, and Sulfa antibiotics   History: Past Medical History:  Diagnosis Date  . Arthritis   . Cataract   . Fibromyalgia   . Glaucoma   . Osteoporosis    Past Surgical History:  Procedure Laterality Date  . CERVICAL SPINE SURGERY    . CESAREAN SECTION    . EYE SURGERY    . SPINE SURGERY     History reviewed. No pertinent family history. Social History   Socioeconomic History  . Marital status: Divorced    Spouse name: Not on file  . Number of children: Not on file  . Years of education: Not on file  . Highest education level: Not on file  Occupational History  . Not on file  Tobacco Use  . Smoking status: Former Research scientist (life sciences)  . Smokeless tobacco: Never Used  Substance and Sexual Activity  . Alcohol use: Yes    Comment: daily  . Drug use: No  . Sexual activity: Not on file  Other Topics Concern  . Not on file  Social History Narrative  . Not on file   Social Determinants of Health   Financial Resource Strain: Low Risk   . Difficulty of Paying Living Expenses: Not hard at all  Food Insecurity: No Food Insecurity  . Worried About Charity fundraiser in the Last Year: Never true  . Ran Out of Food in the Last Year: Never true  Transportation Needs: No Transportation Needs  . Lack of  Transportation (Medical): No  . Lack of Transportation (Non-Medical): No  Physical Activity:   . Days of Exercise per Week:   . Minutes of Exercise per Session:   Stress:   . Feeling of Stress :   Social Connections:   . Frequency of Communication with Friends and Family:   . Frequency of Social Gatherings with Friends and Family:   . Attends Religious Services:   . Active Member of Clubs or Organizations:   . Attends Archivist Meetings:   Marland Kitchen Marital Status:     Tobacco Counseling Counseling given: Not Answered   Clinical Intake: Pain : No/denies pain   Activities of Daily Living In your present state of  health, do you have any difficulty performing the following activities: 06/03/2019  Hearing? N  Vision? N  Difficulty concentrating or making decisions? N  Walking or climbing stairs? N  Dressing or bathing? N  Doing errands, shopping? N  Preparing Food and eating ? N  Using the Toilet? N  In the past six months, have you accidently leaked urine? Y  Do you have problems with loss of bowel control? N  Managing your Medications? N  Managing your Finances? N  Housekeeping or managing your Housekeeping? N  Some recent data might be hidden     Immunizations and Health Maintenance Immunization History  Administered Date(s) Administered  . Moderna SARS-COVID-2 Vaccination 03/21/2019, 04/18/2019   Health Maintenance Due  Topic Date Due  . TETANUS/TDAP  Never done  . PNA vac Low Risk Adult (1 of 2 - PCV13) Never done    Patient Care Team: Copland, Gay Filler, MD as PCP - General (Family Medicine)  Indicate any recent Medical Services you may have received from other than [facility] providers in the past year (date may be approximate).     Assessment:   This is a routine wellness examination for [place]. Physical assessment deferred to PCP.  Hearing/Vision screen Unable to assess. This visit is enabled though telemedicine due to Covid 19.   Dietary issues and exercise activities discussed: Current Exercise Habits: Home exercise routine, Type of exercise: walking, Time (Minutes): 15, Frequency (Times/Week): 3, Weekly Exercise (Minutes/Week): 45, Intensity: Mild, Exercise limited by: None identified   Diet (meal preparation, eat out, water intake, caffeinated beverages, dairy products, fruits and vegetables): 24 hr recall Breakfast: egg, avocado, fruit Lunch: skips Dinner: pho noodle soup  Goals    . DIET - INCREASE WATER INTAKE      Depression Screen PHQ 2/9 Scores 06/03/2019  PHQ - 2 Score 0    Fall Risk Fall Risk  06/03/2019 09/24/2018  Falls in the past year? 0 (No Data)   Comment - Emmi Telephone Survey: data to providers prior to load  Number falls in past yr: 0 (No Data)  Comment - Emmi Telephone Survey Actual Response =   Injury with Fall? 0 -  Follow up Education provided;Falls prevention discussed -   Cognitive Function: Ad8 score reviewed for issues:  Issues making decisions:no  Less interest in hobbies / activities:no  Repeats questions, stories (family complaining):no  Trouble using ordinary gadgets (microwave, computer, phone):no  Forgets the month or year: no  Mismanaging finances: no  Remembering appts:no  Daily problems with thinking and/or memory:no Ad8 score is=0         Screening Tests Health Maintenance  Topic Date Due  . TETANUS/TDAP  Never done  . PNA vac Low Risk Adult (1 of 2 - PCV13) Never done  . INFLUENZA  VACCINE  09/22/2019  . MAMMOGRAM  04/15/2021  . Fecal DNA (Cologuard)  05/18/2022  . DEXA SCAN  Completed  . Hepatitis C Screening  Completed     Plan:    Please schedule your next medicare wellness visit with me in 1 yr.  Continue to eat heart healthy diet (full of fruits, vegetables, whole grains, lean protein, water--limit salt, fat, and sugar intake) and increase physical activity as tolerated.  Continue doing brain stimulating activities (puzzles, reading, adult coloring books, staying active) to keep memory sharp.     I have personally reviewed and noted the following in the patient's chart:   . Medical and social history . Use of alcohol, tobacco or illicit drugs  . Current medications and supplements . Functional ability and status . Nutritional status . Physical activity . Advanced directives . List of other physicians . Hospitalizations, surgeries, and ER visits in previous 12 months . Vitals . Screenings to include cognitive, depression, and falls . Referrals and appointments  In addition, I have reviewed and discussed with patient certain preventive protocols, quality metrics, and  best practice recommendations. A written personalized care plan for preventive services as well as general preventive health recommendations were provided to patient.     Naaman Plummer [place], [place]   06/03/2019

## 2019-06-03 ENCOUNTER — Encounter: Payer: Self-pay | Admitting: *Deleted

## 2019-06-03 ENCOUNTER — Ambulatory Visit (INDEPENDENT_AMBULATORY_CARE_PROVIDER_SITE_OTHER): Payer: PPO | Admitting: *Deleted

## 2019-06-03 ENCOUNTER — Other Ambulatory Visit: Payer: Self-pay

## 2019-06-03 DIAGNOSIS — Z0000 Encounter for general adult medical examination without abnormal findings: Secondary | ICD-10-CM

## 2019-06-03 NOTE — Patient Instructions (Signed)
Please schedule your next medicare wellness visit with me in 1 yr.  Continue to eat heart healthy diet (full of fruits, vegetables, whole grains, lean protein, water--limit salt, fat, and sugar intake) and increase physical activity as tolerated.  Continue doing brain stimulating activities (puzzles, reading, adult coloring books, staying active) to keep memory sharp.    Ruth Vaughn , Thank you for taking time to come for your Medicare Wellness Visit. I appreciate your ongoing commitment to your health goals. Please review the following plan we discussed and let me know if I can assist you in the future.   These are the goals we discussed: Goals    . DIET - INCREASE WATER INTAKE       This is a list of the screening recommended for you and due dates:  Health Maintenance  Topic Date Due  . Tetanus Vaccine  Never done  . Pneumonia vaccines (1 of 2 - PCV13) Never done  . Flu Shot  09/22/2019  . Mammogram  04/15/2021  . Cologuard (Stool DNA test)  05/18/2022  . DEXA scan (bone density measurement)  Completed  .  Hepatitis C: One time screening is recommended by Center for Disease Control  (CDC) for  adults born from [birth year] through 1965.   Completed    Preventive Care 78 Years and Older, Female Preventive care refers to lifestyle choices and visits with your health care provider that can promote health and wellness. This includes:  A yearly physical exam. This is also called an annual well check.  Regular dental and eye exams.  Immunizations.  Screening for certain conditions.  Healthy lifestyle choices, such as diet and exercise. What can I expect for my preventive care visit? Physical exam Your health care provider will check:  Height and weight. These may be used to calculate body mass index (BMI), which is a measurement that tells if you are at a healthy weight.  Heart rate and blood pressure.  Your skin for abnormal spots. Counseling Your health care provider may ask  you questions about:  Alcohol, tobacco, and drug use.  Emotional well-being.  Home and relationship well-being.  Sexual activity.  Eating habits.  History of falls.  Memory and ability to understand (cognition).  Work and work Statistician.  Pregnancy and menstrual history. What immunizations do I need?  Influenza (flu) vaccine  This is recommended every year. Tetanus, diphtheria, and pertussis (Tdap) vaccine  You may need a Td booster every 10 years. Varicella (chickenpox) vaccine  You may need this vaccine if you have not already been vaccinated. Zoster (shingles) vaccine  You may need this after age 85. Pneumococcal conjugate (PCV13) vaccine  One dose is recommended after age 32. Pneumococcal polysaccharide (PPSV23) vaccine  One dose is recommended after age 33. Measles, mumps, and rubella (MMR) vaccine  You may need at least one dose of MMR if you were born in 1957 or later. You may also need a second dose. Meningococcal conjugate (MenACWY) vaccine  You may need this if you have certain conditions. Hepatitis A vaccine  You may need this if you have certain conditions or if you travel or work in places where you may be exposed to hepatitis A. Hepatitis B vaccine  You may need this if you have certain conditions or if you travel or work in places where you may be exposed to hepatitis B. Haemophilus influenzae type b (Hib) vaccine  You may need this if you have certain conditions. You may receive vaccines as  individual doses or as more than one vaccine together in one shot (combination vaccines). Talk with your health care provider about the risks and benefits of combination vaccines. What tests do I need? Blood tests  Lipid and cholesterol levels. These may be checked every 5 years, or more frequently depending on your overall health.  Hepatitis C test.  Hepatitis B test. Screening  Lung cancer screening. You may have this screening every year  starting at age 69 if you have a 30-pack-year history of smoking and currently smoke or have quit within the past 15 years.  Colorectal cancer screening. All adults should have this screening starting at age 76 and continuing until age 62. Your health care provider may recommend screening at age 44 if you are at increased risk. You will have tests every 1-10 years, depending on your results and the type of screening test.  Diabetes screening. This is done by checking your blood sugar (glucose) after you have not eaten for a while (fasting). You may have this done every 1-3 years.  Mammogram. This may be done every 1-2 years. Talk with your health care provider about how often you should have regular mammograms.  BRCA-related cancer screening. This may be done if you have a family history of breast, ovarian, tubal, or peritoneal cancers. Other tests  Sexually transmitted disease (STD) testing.  Bone density scan. This is done to screen for osteoporosis. You may have this done starting at age 58. Follow these instructions at home: Eating and drinking  Eat a diet that includes fresh fruits and vegetables, whole grains, lean protein, and low-fat dairy products. Limit your intake of foods with high amounts of sugar, saturated fats, and salt.  Take vitamin and mineral supplements as recommended by your health care provider.  Do not drink alcohol if your health care provider tells you not to drink.  If you drink alcohol: ? Limit how much you have to 0-1 drink a day. ? Be aware of how much alcohol is in your drink. In the [place] one drink equals one 12 oz bottle of beer (355 mL), one 5 oz glass of wine (148 mL), or one 1 oz glass of hard liquor (44 mL). Lifestyle  Take daily care of your teeth and gums.  Stay active. Exercise for at least 30 minutes on 5 or more days each week.  Do not use any products that contain nicotine or tobacco, such as cigarettes, e-cigarettes, and chewing tobacco. If  you need help quitting, ask your health care provider.  If you are sexually active, practice safe sex. Use a condom or other form of protection in order to prevent STIs (sexually transmitted infections).  Talk with your health care provider about taking a low-dose aspirin or statin. What's next?  Go to your health care provider once a year for a well check visit.  Ask your health care provider how often you should have your eyes and teeth checked.  Stay up to date on all vaccines. This information is not intended to replace advice given to you by your health care provider. Make sure you discuss any questions you have with your health care provider. Document Revised: 02/01/2018 Document Reviewed: 02/01/2018 Elsevier Patient Education  2020 [employer].

## 2019-06-11 DIAGNOSIS — L820 Inflamed seborrheic keratosis: Secondary | ICD-10-CM | POA: Diagnosis not present

## 2019-07-13 NOTE — Progress Notes (Addendum)
[facility] at [facility] [place], [place], [place] [address] [phone]  Date:  07/15/2019   Name:  Ruth Vaughn   DOB:  04/23/1950   MRN:  [id number]  PCP:  Darreld Mclean, MD    Chief Complaint: Urinary Frequency (incontinence, abdominal pain, flank patient )   History of Present Illness:  Ruth Vaughn is a 69 y.o. very pleasant female patient who presents with the following:  Patient with history of osteoporosis, B12 and vitamin D deficiency, glaucoma, vaginal atrophy on topical estrogen Here today with concern of possible UTI- she has noted sx of lower abd pain (felt like a kidney stone per pt- she did have one years ago) and lower back pain.  4 days ago she had urinary urgency and did not feel well- nausea.  Her daughter gave her some kombucka and she felt better.  She continues to have some back pain, but her urinary sx are actually better No hematuria   Last seen by myself in February.  At that time she was in the midst of getting her COVID-19 series- now done  Cologuard completed this year, negative  Can offer pneumonia series Tetanus Shingrix Patient Active Problem List   Diagnosis Date Noted  . Osteopenia 04/17/2019  . Right shoulder pain 07/11/2017  . Vitamin D deficiency 01/10/2017  . Vitamin B12 deficiency 07/25/2016  . Depression, recurrent ([place]) 07/25/2016    Past Medical History:  Diagnosis Date  . Arthritis   . Cataract   . Fibromyalgia   . Glaucoma   . Osteoporosis     Past Surgical History:  Procedure Laterality Date  . CERVICAL SPINE SURGERY    . CESAREAN SECTION    . EYE SURGERY    . SPINE SURGERY      Social History   Tobacco Use  . Smoking status: Former Research scientist (life sciences)  . Smokeless tobacco: Never Used  Substance Use Topics  . Alcohol use: Yes    Comment: daily  . Drug use: No    History reviewed. No pertinent family history.  Allergies  Allergen Reactions  .  Codeine Swelling  . Erythromycin   . Penicillins Rash  . Povidone Iodine Dermatitis and Rash  . Sulfa Antibiotics Swelling and Rash    Medication list has been reviewed and updated.  Current Outpatient Medications on File Prior to Visit  Medication Sig Dispense Refill  . Cholecalciferol (VITAMIN D PO) Take by mouth.    . estradiol (ESTRACE) 0.1 MG/GM vaginal cream Use 0.5gram cream vaginally 1-3x a week as needed 42.5 g 5  . Fish Oil OIL by Does not apply route.    . fluticasone (FLONASE) 50 MCG/ACT nasal spray Place 2 sprays into both nostrils daily. 16 g 2  . Glucosamine HCl (GLUCOSAMINE PO) Take by mouth.    . Multiple Vitamin (MULTI-VITAMIN DAILY PO) Take by mouth.    Marland Kitchen OVER THE COUNTER MEDICATION Curcum 2250 mg: Take 1 capsule daily    . TURMERIC PO Take by mouth.     No current facility-administered medications on file prior to visit.    Review of Systems:  As per HPI- otherwise negative.   Physical Examination: Vitals:   07/15/19 0920  BP: 124/82  Pulse: 74  Resp: 16  Temp: 97.9 F (36.6 C)  SpO2: 97%   Vitals:   07/15/19 0920  Weight: 117 lb (53.1 kg)  Height: 4\' 11"  (1.499 m)   Body  mass index is 23.63 kg/m. Ideal Body Weight: Weight in (lb) to have BMI = 25: 123.5  GEN: no acute distress.  Normal weight, looks well HEENT: Atraumatic, Normocephalic.  Ears and Nose: No external deformity. CV: RRR, No M/G/R. No JVD. No thrill. No extra heart sounds. PULM: CTA B, no wheezes, crackles, rhonchi. No retractions. No resp. distress. No accessory muscle use. ABD: S, NT, ND, +BS. No rebound. No HSM.  Belly is benign, no CVA tenderness EXTR: No c/c/e PSYCH: Normally interactive. Conversant.   Results for orders placed or performed in visit on 07/15/19  POCT urinalysis dipstick  Result Value Ref Range   Color, UA yellow yellow   Clarity, UA cloudy (A) clear   Glucose, UA negative negative mg/dL   Bilirubin, UA negative negative   Ketones, POC UA negative  negative mg/dL   Spec Grav, UA 1.025 1.010 - 1.025   Blood, UA small (A) negative   pH, UA 6.0 5.0 - 8.0   Protein Ur, POC negative negative mg/dL   Urobilinogen, UA 0.2 0.2 or 1.0 E.U./dL   Nitrite, UA Negative Negative   Leukocytes, UA Negative Negative    Assessment and Plan: Urinary frequency - Plan: Urine Culture, POCT urinalysis dipstick, Urine Microscopic Only, nitrofurantoin, macrocrystal-monohydrate, (MACROBID) 100 MG capsule  Patient here today with recent history of lower back pain, urinary frequency.  Urine analysis shows blood, otherwise negative.  Sent for urine micro and culture.  We will start her on Macrobid for the time being-she will hydrate and let us know if getting worse or any other concerns in the meantime  Moderate medical decision making today  This visit occurred during the SARS-CoV-2 public health emergency.  Safety protocols were in place, including screening questions prior to the visit, additional usage of staff PPE, and extensive cleaning of exam room while observing appropriate contact time as indicated for disinfecting solutions.    Signed Lamar Blinks, MD  Received her urine culture 5/26- negative for infection ?stone.  Message to pt Results for orders placed or performed in visit on 07/15/19  Urine Culture   Specimen: Urine  Result Value Ref Range   MICRO NUMBER: [id number]    SPECIMEN QUALITY: Adequate    Sample Source NOT GIVEN    STATUS: FINAL    Result: No Growth   Urine Microscopic Only  Result Value Ref Range   WBC, UA 0-2/hpf 0-2/hpf   RBC / HPF 3-6/hpf (A) 0-2/hpf   Squamous Epithelial / LPF Rare(0-4/hpf) Rare(0-4/hpf)  POCT urinalysis dipstick  Result Value Ref Range   Color, UA yellow yellow   Clarity, UA cloudy (A) clear   Glucose, UA negative negative mg/dL   Bilirubin, UA negative negative   Ketones, POC UA negative negative mg/dL   Spec Grav, UA 1.025 1.010 - 1.025   Blood, UA small (A) negative   pH, UA 6.0 5.0 - 8.0    Protein Ur, POC negative negative mg/dL   Urobilinogen, UA 0.2 0.2 or 1.0 E.U./dL   Nitrite, UA Negative Negative   Leukocytes, UA Negative Negative

## 2019-07-15 ENCOUNTER — Encounter: Payer: Self-pay | Admitting: Family Medicine

## 2019-07-15 ENCOUNTER — Ambulatory Visit (INDEPENDENT_AMBULATORY_CARE_PROVIDER_SITE_OTHER): Payer: PPO | Admitting: Family Medicine

## 2019-07-15 ENCOUNTER — Other Ambulatory Visit: Payer: Self-pay

## 2019-07-15 VITALS — BP 124/82 | HR 74 | Temp 97.9°F | Resp 16 | Ht 59.0 in | Wt 117.0 lb

## 2019-07-15 DIAGNOSIS — R3129 Other microscopic hematuria: Secondary | ICD-10-CM

## 2019-07-15 DIAGNOSIS — R350 Frequency of micturition: Secondary | ICD-10-CM

## 2019-07-15 LAB — POCT URINALYSIS DIP (MANUAL ENTRY)
Bilirubin, UA: NEGATIVE
Glucose, UA: NEGATIVE mg/dL
Ketones, POC UA: NEGATIVE mg/dL
Leukocytes, UA: NEGATIVE
Nitrite, UA: NEGATIVE
Protein Ur, POC: NEGATIVE mg/dL
Spec Grav, UA: 1.025 (ref 1.010–1.025)
Urobilinogen, UA: 0.2 E.U./dL
pH, UA: 6 (ref 5.0–8.0)

## 2019-07-15 LAB — URINALYSIS, MICROSCOPIC ONLY

## 2019-07-15 MED ORDER — NITROFURANTOIN MONOHYD MACRO 100 MG PO CAPS: 100.0000 mg | ORAL_CAPSULE | Freq: Two times a day (BID) | ORAL | 0 refills | Status: DC

## 2019-07-15 NOTE — Patient Instructions (Addendum)
Good to see you again today- let me know if you are not feeling back to normal soon We will treat you for a presumed UTI with macrobid twice a day for one week.   I would encourage you to have the pneumonia vaccine series, shingrix series and tetanus booster at your convenience

## 2019-07-16 LAB — URINE CULTURE
MICRO NUMBER:: 10511793
Result:: NO GROWTH
SPECIMEN QUALITY:: ADEQUATE

## 2019-07-17 ENCOUNTER — Encounter: Payer: Self-pay | Admitting: Family Medicine

## 2019-07-17 DIAGNOSIS — R3129 Other microscopic hematuria: Secondary | ICD-10-CM

## 2019-07-17 NOTE — Addendum Note (Signed)
Addended by: Lamar Blinks C on: 07/17/2019 06:04 AM   Modules accepted: Orders

## 2019-10-02 ENCOUNTER — Encounter: Payer: Self-pay | Admitting: Family Medicine

## 2019-10-02 DIAGNOSIS — H401131 Primary open-angle glaucoma, bilateral, mild stage: Secondary | ICD-10-CM | POA: Diagnosis not present

## 2019-10-02 DIAGNOSIS — H04123 Dry eye syndrome of bilateral lacrimal glands: Secondary | ICD-10-CM | POA: Diagnosis not present

## 2019-10-02 DIAGNOSIS — H43811 Vitreous degeneration, right eye: Secondary | ICD-10-CM | POA: Diagnosis not present

## 2019-10-02 DIAGNOSIS — H26491 Other secondary cataract, right eye: Secondary | ICD-10-CM | POA: Diagnosis not present

## 2019-10-02 NOTE — Telephone Encounter (Signed)
No availability with you this week however I can try and offer her with another provider. Please advise what I can do for patient.

## 2019-10-02 NOTE — Telephone Encounter (Signed)
Can we get her in tomorrow in one of the held spots?

## 2019-10-03 ENCOUNTER — Encounter: Payer: Self-pay | Admitting: Family Medicine

## 2019-10-03 ENCOUNTER — Ambulatory Visit (INDEPENDENT_AMBULATORY_CARE_PROVIDER_SITE_OTHER): Payer: PPO | Admitting: Family Medicine

## 2019-10-03 ENCOUNTER — Other Ambulatory Visit: Payer: Self-pay

## 2019-10-03 VITALS — BP 118/72 | HR 80 | Temp 98.6°F | Resp 16 | Ht 59.0 in | Wt 116.0 lb

## 2019-10-03 DIAGNOSIS — E538 Deficiency of other specified B group vitamins: Secondary | ICD-10-CM

## 2019-10-03 DIAGNOSIS — R0789 Other chest pain: Secondary | ICD-10-CM

## 2019-10-03 DIAGNOSIS — R5382 Chronic fatigue, unspecified: Secondary | ICD-10-CM

## 2019-10-03 DIAGNOSIS — Z638 Other specified problems related to primary support group: Secondary | ICD-10-CM

## 2019-10-03 LAB — BASIC METABOLIC PANEL
BUN: 10 mg/dL (ref 6–23)
CO2: 29 mEq/L (ref 19–32)
Calcium: 9.6 mg/dL (ref 8.4–10.5)
Chloride: 102 mEq/L (ref 96–112)
Creatinine, Ser: 0.74 mg/dL (ref 0.40–1.20)
GFR: 77.86 mL/min (ref >60.00)
Glucose, Bld: 94 mg/dL (ref 70–99)
Potassium: 4.7 mEq/L (ref 3.5–5.1)
Sodium: 139 mEq/L (ref 135–145)

## 2019-10-03 LAB — CBC
HCT: 42.9 % (ref 36.0–46.0)
Hemoglobin: 14.4 g/dL (ref 12.0–15.0)
MCHC: 33.6 g/dL (ref 30.0–36.0)
MCV: 94 fl (ref 78.0–100.0)
Platelets: 264 10*3/uL (ref 150.0–400.0)
RBC: 4.57 Mil/uL (ref 3.87–5.11)
RDW: 14.3 % (ref 11.5–15.5)
WBC: 6.5 10*3/uL (ref 4.0–10.5)

## 2019-10-03 LAB — VITAMIN B12: Vitamin B-12: 535 pg/mL (ref 211–911)

## 2019-10-03 LAB — FERRITIN: Ferritin: 36 ng/mL (ref 10.0–291.0)

## 2019-10-03 LAB — TSH: TSH: 0.95 u[IU]/mL (ref 0.35–4.50)

## 2019-10-03 LAB — VITAMIN D 25 HYDROXY (VIT D DEFICIENCY, FRACTURES): VITD: 60.02 ng/mL (ref 30.00–100.00)

## 2019-10-03 MED ORDER — CLONAZEPAM 0.5 MG PO TABS: 0.2500 mg | ORAL_TABLET | Freq: Three times a day (TID) | ORAL | 1 refills | Status: DC | PRN

## 2019-10-03 NOTE — Patient Instructions (Addendum)
It was good to see you again today, but I am sorry you are having such a hard time!  We will set you up for a stress test to make sure all is ok with your heart We will be in touch with your labs Try the clonazepam as needed for anxiety Let me know if you are not doing ok

## 2019-10-03 NOTE — Progress Notes (Addendum)
[facility] at [facility] [address] [place], [place] [address] [phone]  Date:  10/03/2019   Name:  Ruth Vaughn   DOB:  [birth year]   MRN:  [id number]  PCP:  Darreld Mclean, MD    Chief Complaint: Stress (trouble sleeping less) and Fatigue (wants to sleep all day)   History of Present Illness:  Ruth Vaughn is a 69 y.o. very pleasant female patient who presents with the following:  Generally healthy woman with history of osteopenia, vitamin B12 and vitamin D deficiency, glaucoma, vaginal atrophy on topical estrogen She contacted [place] recently with concern of a family crisis causing anxiety-  I have had a family crises that is causing me great emotional turmoil. I am having trouble sleeping and have constant upset stomach as well as having difficulty controlling weeping. . Is there a temporary and quick medication or supplement that you can prescribe? A medication you prescribed previously didn't agree with me.  I am not depressed and do not need something long term. Just having difficulty coping and moving through the day.   Otherwise I last saw her in May COVID-19 series is complete Can offer pneumonia vaccine- declines today   Looking through her chart, it appears we tried venlafaxine in 2018-however, this medication really did not seem to agree with her.  She felt like it actually made her mood much worse and only took it for a few days  Pt reports that her 43 yo son was arrested recently- he suffers from mental illness and is not on his meds right now. He is in jail right now, they have not been able to bail him out due to high bond  This all occurred one week ago.  This is the main source of her stress  She is not able to eat She is not sleeping well at all Not panic attacks  She is feeling really tired out the last few months as well- about 3 months maybe  No CP However patient does note a sensation of  shortness of breath which can occur sometimes with exertion or at rest.  It feels as though she has to take a deep breath and has difficulty catching her breath  Patient Active Problem List   Diagnosis Date Noted  . Osteopenia 04/17/2019  . Right shoulder pain 07/11/2017  . Vitamin D deficiency 01/10/2017  . Vitamin B12 deficiency 07/25/2016  . Depression, recurrent ([place]) 07/25/2016    Past Medical History:  Diagnosis Date  . Arthritis   . Cataract   . Fibromyalgia   . Glaucoma   . Osteoporosis     Past Surgical History:  Procedure Laterality Date  . CERVICAL SPINE SURGERY    . CESAREAN SECTION    . EYE SURGERY    . SPINE SURGERY      Social History   Tobacco Use  . Smoking status: Former Research scientist (life sciences)  . Smokeless tobacco: Never Used  Substance Use Topics  . Alcohol use: Yes    Comment: daily  . Drug use: No    History reviewed. No pertinent family history.  Allergies  Allergen Reactions  . Codeine Swelling  . Erythromycin   . Penicillins Rash  . Povidone Iodine Dermatitis and Rash  . Sulfa Antibiotics Swelling and Rash    Medication list has been reviewed and updated.  Current Outpatient Medications on File Prior to Visit  Medication Sig Dispense Refill  . Cholecalciferol (  VITAMIN D PO) Take by mouth.    . estradiol (ESTRACE) 0.1 MG/GM vaginal cream Use 0.5gram cream vaginally 1-3x a week as needed 42.5 g 5  . Fish Oil OIL by Does not apply route.    . fluticasone (FLONASE) 50 MCG/ACT nasal spray Place 2 sprays into both nostrils daily. 16 g 2  . Glucosamine HCl (GLUCOSAMINE PO) Take by mouth.    . Multiple Vitamin (MULTI-VITAMIN DAILY PO) Take by mouth.    Marland Kitchen OVER THE COUNTER MEDICATION Curcum 2250 mg: Take 1 capsule daily    . THEANINE PO Take by mouth as needed.    . TURMERIC PO Take by mouth.     No current facility-administered medications on file prior to visit.    Review of Systems:  As per HPI- otherwise negative.   Physical  Examination: Vitals:   10/03/19 0959  BP: 118/72  Pulse: 80  Resp: 16  Temp: 98.6 F (37 C)  SpO2: 94%   Vitals:   10/03/19 0959  Weight: 116 lb (52.6 kg)  Height: 4\' 11"  (1.499 m)   Body mass index is 23.43 kg/m. Ideal Body Weight: Weight in (lb) to have BMI = 25: 123.5  GEN: no acute distress.  Petite build, looks well  HEENT: Atraumatic, Normocephalic.  Ears and Nose: No external deformity. CV: RRR, No M/G/R. No JVD. No thrill. No extra heart sounds. PULM: CTA B, no wheezes, crackles, rhonchi. No retractions. No resp. distress. No accessory muscle use. ABD: S, NT, ND, +BS. No rebound. No HSM. EXTR: No c/c/e PSYCH: Normally interactive. Conversant.   EKG: SR, minimally short PR interval of 112 ms. Compared to EKG 2018- PR 114 ms at that time.  No other acute changes or findings  Assessment and Plan: Stress due to family tension - Plan: clonazePAM (KLONOPIN) 0.5 MG tablet  Chest tightness - Plan: EKG 12-Lead, Exercise Tolerance Test  Chronic fatigue - Plan: Basic metabolic panel, CBC, TSH, VITAMIN D 25 Hydroxy (Vit-D Deficiency, Fractures), Ferritin  B12 deficiency - Plan: Vitamin B12  -Patient here today with a couple concerns.  She is very stressed because her son is currently in jail, he is accused of a crime -she does not go into details today Discussed starting a baseline antidepressant.  For the time being she declines, she would like to have a as needed medication to take for anxiety.  I prescribed her clonazepam to use as needed, encouraged to use this medication with caution, do not drive this medication, do not combine with alcohol  She also has noted fatigue for few months.  Labs are pending as above  Chest tightness, sometimes with exertion.  No chest pain.  No symptoms currently.  EKG is reassuring.  Will refer for a treadmill stress test, she is reminded to seek care in the interim if worse This visit occurred during the SARS-CoV-2 public health emergency.   Safety protocols were in place, including screening questions prior to the visit, additional usage of staff PPE, and extensive cleaning of exam room while observing appropriate contact time as indicated for disinfecting solutions.    Signed Lamar Blinks, MD  Received her labs as below, message to patient  Results for orders placed or performed in visit on 34/19/37  Basic metabolic panel  Result Value Ref Range   Sodium 139 135 - 145 mEq/L   Potassium 4.7 3.5 - 5.1 mEq/L   Chloride 102 96 - 112 mEq/L   CO2 29 19 - 32 mEq/L  Glucose, Bld 94 70 - 99 mg/dL   BUN 10 6 - 23 mg/dL   Creatinine, Ser 0.74 0.40 - 1.20 mg/dL   GFR 77.86 >60.00 mL/min   Calcium 9.6 8.4 - 10.5 mg/dL  CBC  Result Value Ref Range   WBC 6.5 4.0 - 10.5 K/uL   RBC 4.57 3.87 - 5.11 Mil/uL   Platelets 264.0 150 - 400 K/uL   Hemoglobin 14.4 12.0 - 15.0 g/dL   HCT 42.9 36 - 46 %   MCV 94.0 78.0 - 100.0 fl   MCHC 33.6 30.0 - 36.0 g/dL   RDW 14.3 11.5 - 15.5 %  TSH  Result Value Ref Range   TSH 0.95 0.35 - 4.50 uIU/mL  VITAMIN D 25 Hydroxy (Vit-D Deficiency, Fractures)  Result Value Ref Range   VITD 60.02 30.00 - 100.00 ng/mL  Ferritin  Result Value Ref Range   Ferritin 36.0 10.0 - 291.0 ng/mL  Vitamin B12  Result Value Ref Range   Vitamin B-12 535 211 - 911 pg/mL   Blood counts are normal Metabolic profile normal Thyroid okay, iron and B12, vitamin D all normal No definite explanation for your fatigue I hope this will improve as your stress lessens, please let me know if you continue to struggle

## 2019-10-10 DIAGNOSIS — N3946 Mixed incontinence: Secondary | ICD-10-CM | POA: Diagnosis not present

## 2019-10-10 DIAGNOSIS — R350 Frequency of micturition: Secondary | ICD-10-CM | POA: Diagnosis not present

## 2019-10-10 DIAGNOSIS — R3129 Other microscopic hematuria: Secondary | ICD-10-CM | POA: Diagnosis not present

## 2019-10-14 ENCOUNTER — Encounter: Payer: Self-pay | Admitting: Family Medicine

## 2019-10-14 DIAGNOSIS — R3129 Other microscopic hematuria: Secondary | ICD-10-CM | POA: Insufficient documentation

## 2019-10-29 DIAGNOSIS — R3129 Other microscopic hematuria: Secondary | ICD-10-CM | POA: Diagnosis not present

## 2019-11-05 DIAGNOSIS — N3946 Mixed incontinence: Secondary | ICD-10-CM | POA: Diagnosis not present

## 2019-11-05 DIAGNOSIS — R3129 Other microscopic hematuria: Secondary | ICD-10-CM | POA: Diagnosis not present

## 2019-11-05 DIAGNOSIS — I878 Other specified disorders of veins: Secondary | ICD-10-CM | POA: Diagnosis not present

## 2019-11-05 DIAGNOSIS — K7689 Other specified diseases of liver: Secondary | ICD-10-CM | POA: Diagnosis not present

## 2019-11-08 ENCOUNTER — Other Ambulatory Visit (HOSPITAL_COMMUNITY)
Admission: RE | Admit: 2019-11-08 | Discharge: 2019-11-08 | Disposition: A | Discharge status: Home / Self Care | Payer: PPO | Source: Ambulatory Visit | Attending: Family Medicine | Admitting: Family Medicine

## 2019-11-08 DIAGNOSIS — Z01812 Encounter for preprocedural laboratory examination: Secondary | ICD-10-CM | POA: Diagnosis not present

## 2019-11-08 DIAGNOSIS — Z20822 Contact with and (suspected) exposure to covid-19: Secondary | ICD-10-CM | POA: Insufficient documentation

## 2019-11-08 LAB — SARS CORONAVIRUS 2 (TAT 6-24 HRS): SARS Coronavirus 2: NEGATIVE

## 2019-11-12 ENCOUNTER — Encounter: Payer: Self-pay | Admitting: Family Medicine

## 2019-11-12 ENCOUNTER — Ambulatory Visit (INDEPENDENT_AMBULATORY_CARE_PROVIDER_SITE_OTHER): Payer: PPO

## 2019-11-12 ENCOUNTER — Other Ambulatory Visit: Payer: Self-pay

## 2019-11-12 DIAGNOSIS — R0789 Other chest pain: Secondary | ICD-10-CM | POA: Diagnosis not present

## 2019-11-12 LAB — EXERCISE TOLERANCE TEST
Estimated workload: 7 METS
Exercise duration (min): 6 min
Exercise duration (sec): 0 s
MPHR: 152 {beats}/min
Peak HR: 137 {beats}/min
Percent HR: 90 %
RPE: 16
Rest HR: 73 {beats}/min

## 2019-12-11 DIAGNOSIS — J320 Chronic maxillary sinusitis: Secondary | ICD-10-CM | POA: Diagnosis not present

## 2019-12-20 DIAGNOSIS — K121 Other forms of stomatitis: Secondary | ICD-10-CM | POA: Diagnosis not present

## 2020-01-02 DIAGNOSIS — K121 Other forms of stomatitis: Secondary | ICD-10-CM | POA: Diagnosis not present

## 2020-04-09 ENCOUNTER — Encounter: Payer: Self-pay | Admitting: Family Medicine

## 2020-04-09 DIAGNOSIS — H26491 Other secondary cataract, right eye: Secondary | ICD-10-CM | POA: Diagnosis not present

## 2020-04-09 DIAGNOSIS — H401131 Primary open-angle glaucoma, bilateral, mild stage: Secondary | ICD-10-CM | POA: Diagnosis not present

## 2020-04-09 DIAGNOSIS — H04123 Dry eye syndrome of bilateral lacrimal glands: Secondary | ICD-10-CM | POA: Diagnosis not present

## 2020-04-09 DIAGNOSIS — Z961 Presence of intraocular lens: Secondary | ICD-10-CM | POA: Diagnosis not present

## 2020-04-27 ENCOUNTER — Encounter: Payer: Self-pay | Admitting: Family Medicine

## 2020-04-30 ENCOUNTER — Ambulatory Visit (INDEPENDENT_AMBULATORY_CARE_PROVIDER_SITE_OTHER): Payer: PPO | Admitting: Family Medicine

## 2020-04-30 ENCOUNTER — Encounter: Payer: Self-pay | Admitting: Family Medicine

## 2020-04-30 ENCOUNTER — Other Ambulatory Visit: Payer: Self-pay

## 2020-04-30 VITALS — BP 120/60 | HR 67 | Temp 98.1°F | Ht <= 58 in | Wt 110.8 lb

## 2020-04-30 DIAGNOSIS — Z23 Encounter for immunization: Secondary | ICD-10-CM

## 2020-04-30 DIAGNOSIS — H6122 Impacted cerumen, left ear: Secondary | ICD-10-CM | POA: Diagnosis not present

## 2020-04-30 NOTE — Patient Instructions (Addendum)
It was good to see you again today, please see me in about 6 months assuming all is well We do labs at our next visit if you like  Take care!

## 2020-04-30 NOTE — Progress Notes (Signed)
[facility] [place], [place], [place] [address] [phone]  Date:  04/30/2020   Name:  Ruth Vaughn   DOB:  1950/09/19   MRN:  [id number]  PCP:  Darreld Mclean, MD    Chief Complaint: Cerumen Impaction   History of Present Illness:  Ruth Vaughn is a 70 y.o. very pleasant female patient who presents with the following:  Patient is here today with concern of earwax needing removal- history of osteopenia, vitamin B12 and vitamin D deficiency, glaucoma, vaginal atrophy on topical estrogen She is trying to have her hearing evaluated, but first needs to have her ears cleaned No pain, just a little itching  Last seen by myself in August-at that time she was under a lot of stress due to some issues with her son.  She declined an antidepressant, I gave her clonazepam to use as needed She notes that things are more under control at this time and she is feeling better  She had some oral surgery not long ago - she has done pretty well, still has a little pain   Flu vaccine- she declines  COVID-19 booster - done  Tetanus booster- will update today, she has a bite on her right wrist  Mammogram 1 year ago Pneumonia vaccine-pt declines today  Most recent labs done in October Patient Active Problem List   Diagnosis Date Noted  . Microscopic hematuria 10/14/2019  . Osteopenia 04/17/2019  . Right shoulder pain 07/11/2017  . Vitamin D deficiency 01/10/2017  . Vitamin B12 deficiency 07/25/2016  . Depression, recurrent ([place]) 07/25/2016    Past Medical History:  Diagnosis Date  . Arthritis   . Cataract   . Fibromyalgia   . Glaucoma   . Osteoporosis     Past Surgical History:  Procedure Laterality Date  . CERVICAL SPINE SURGERY    . CESAREAN SECTION    . EYE SURGERY    . SPINE SURGERY      Social History   Tobacco Use  . Smoking status: Former Research scientist (life sciences)  . Smokeless tobacco: Never Used  Substance  Use Topics  . Alcohol use: Yes    Comment: daily  . Drug use: No    History reviewed. No pertinent family history.  Allergies  Allergen Reactions  . Codeine Swelling  . Erythromycin   . Penicillins Rash  . Povidone Iodine Dermatitis and Rash  . Sulfa Antibiotics Swelling and Rash    Medication list has been reviewed and updated.  Current Outpatient Medications on File Prior to Visit  Medication Sig Dispense Refill  . Cholecalciferol (VITAMIN D PO) Take by mouth.    . Fish Oil OIL by Does not apply route.    . Glucosamine HCl (GLUCOSAMINE PO) Take by mouth.    . Multiple Vitamin (MULTI-VITAMIN DAILY PO) Take by mouth.    Marland Kitchen OVER THE COUNTER MEDICATION Curcum 2250 mg: Take 1 capsule daily    . THEANINE PO Take by mouth as needed.    . TURMERIC PO Take by mouth.    . clonazePAM (KLONOPIN) 0.5 MG tablet Take 0.5-1 tablets (0.25-0.5 mg total) by mouth 3 (three) times daily as needed for anxiety. (Patient not taking: Reported on 04/30/2020) 30 tablet 1  . estradiol (ESTRACE) 0.1 MG/GM vaginal cream Use 0.5gram cream vaginally 1-3x a week as needed (Patient not taking: Reported on 04/30/2020) 42.5 g 5  . fluticasone (FLONASE) 50 MCG/ACT nasal spray Place  2 sprays into both nostrils daily. (Patient not taking: Reported on 04/30/2020) 16 g 2   No current facility-administered medications on file prior to visit.    Review of Systems:  As per HPI- otherwise negative.   Physical Examination: Vitals:   04/30/20 1039  BP: 120/60  Pulse: 67  Temp: 98.1 F (36.7 C)  SpO2: 99%   Vitals:   04/30/20 1039  Weight: 110 lb 12.8 oz (50.3 kg)  Height: 4\' 10"  (1.473 m)   Body mass index is 23.16 kg/m. Ideal Body Weight: Weight in (lb) to have BMI = 25: 119.4  GEN: no acute distress.  Normal weight, looks well  HEENT: Atraumatic, Normocephalic.  Ears and Nose: No external deformity. CV: RRR, No M/G/R. No JVD. No thrill. No extra heart sounds. PULM: CTA B, no wheezes, crackles,  rhonchi. No retractions. No resp. distress. No accessory muscle use. EXTR: No c/c/e PSYCH: Normally interactive. Conversant.  Cerumen in bilateral ear canals.  Patient gives verbal consent for removal.  Discussed risks, benefits, alternatives Removed cerumen from right ear with curette Left required warm water irrigation After treatment both ears displayed normal ear canals and normal TM  Small wound on right wrist from recent insect bite    Assessment and Plan: Immunization due - Plan: Td vaccine greater than or equal to 7yo preservative free IM  Impacted cerumen of left ear  Here today for removal of earwax, accomplished as above.  We also updated her tetanus vaccine Offered blood work today, she would prefer to wait until next visit which is fine This visit occurred during the SARS-CoV-2 public health emergency.  Safety protocols were in place, including screening questions prior to the visit, additional usage of staff PPE, and extensive cleaning of exam room while observing appropriate contact time as indicated for disinfecting solutions.    Signed Lamar Blinks, MD

## 2020-10-08 DIAGNOSIS — H401131 Primary open-angle glaucoma, bilateral, mild stage: Secondary | ICD-10-CM | POA: Diagnosis not present

## 2020-10-31 NOTE — Progress Notes (Signed)
[place] at [facility] [address] [place], [place] [address] [phone]  Date:  11/04/2020   Name:  Ruth Vaughn   DOB:  Jul 23, 1950   MRN:  [id number]  PCP:  Darreld Mclean, MD    Chief Complaint: Hand Pain (Right hand pain, thumb pain, knot, locks up, couple of months)   History of Present Illness:  Ruth Vaughn is a 70 y.o. very pleasant female patient who presents with the following:  Patient seen today with concern of hand problem History of osteopenia, B12 and vitamin D deficiency, glaucoma Last seen by myself in March of this year   ?  Pack years- off and on for 15 years, maybe up to 1/2 PPD Shingles vaccine COVID-19 booster Flu shot  She is having difficulty with her right thumb.  She is right handed It is painful at the 1st MCP and at the first IP joint  NKI It is been painful for a while but worse recently She has rested it- less sewing than she typically would  She has been very active with her hands over her life   She is using naproxen as needed    Patient Active Problem List   Diagnosis Date Noted   Microscopic hematuria 10/14/2019   Osteopenia 04/17/2019   Right shoulder pain 07/11/2017   Vitamin D deficiency 01/10/2017   Vitamin B12 deficiency 07/25/2016   Depression, recurrent ([place]) 07/25/2016    Past Medical History:  Diagnosis Date   Arthritis    Cataract    Fibromyalgia    Glaucoma    Osteoporosis     Past Surgical History:  Procedure Laterality Date   CERVICAL SPINE SURGERY     CESAREAN SECTION     EYE SURGERY     SPINE SURGERY      Social History   Tobacco Use   Smoking status: Former   Smokeless tobacco: Never  Substance Use Topics   Alcohol use: Yes    Comment: daily   Drug use: No    No family history on file.  Allergies  Allergen Reactions   Codeine Swelling   Erythromycin    Penicillins Rash   Povidone Iodine Dermatitis and Rash   Sulfa  Antibiotics Swelling and Rash    Medication list has been reviewed and updated.  Current Outpatient Medications on File Prior to Visit  Medication Sig Dispense Refill   Cholecalciferol (VITAMIN D PO) Take by mouth.     Fish Oil OIL by Does not apply route.     fluticasone (FLONASE) 50 MCG/ACT nasal spray Place 2 sprays into both nostrils daily. 16 g 2   Glucosamine HCl (GLUCOSAMINE PO) Take by mouth.     Multiple Vitamin (MULTI-VITAMIN DAILY PO) Take by mouth.     OVER THE COUNTER MEDICATION Curcum 2250 mg: Take 1 capsule daily     THEANINE PO Take by mouth as needed.     TURMERIC PO Take by mouth.     clonazePAM (KLONOPIN) 0.5 MG tablet Take 0.5-1 tablets (0.25-0.5 mg total) by mouth 3 (three) times daily as needed for anxiety. (Patient not taking: Reported on 11/04/2020) 30 tablet 1   No current facility-administered medications on file prior to visit.    Review of Systems:  As per HPI- otherwise negative.   Physical Examination: Vitals:   11/04/20 1316  BP: 112/74  Pulse: 67  Resp: 15  Temp: 97.8 F (36.6 C)  SpO2: 97%   Vitals:   11/04/20 1316  Weight: 116 lb (52.6 kg)  Height: '4\' 10"'$  (1.473 m)   Body mass index is 24.24 kg/m. Ideal Body Weight: Weight in (lb) to have BMI = 25: 119.4  GEN: no acute distress. Normal weight, looks well  HEENT: Atraumatic, Normocephalic.  Ears and Nose: No external deformity. CV: RRR, No M/G/R. No JVD. No thrill. No extra heart sounds. PULM: CTA B, no wheezes, crackles, rhonchi. No retractions. No resp. distress. No accessory muscle use. EXTR: No c/c/e PSYCH: Normally interactive. Conversant.  Right hand: thickening and some stiffness of right thumb and MCP and IP joints No heat or redness No trigger Grip strength 4/5  Assessment and Plan: Chronic pain of right thumb  B12 deficiency - Plan: Vitamin B12  Screening, deficiency anemia, iron - Plan: CBC  Screening for diabetes mellitus - Plan: Comprehensive metabolic panel,  Hemoglobin A1c  Screening for hyperlipidemia - Plan: Lipid panel  Immunization due Right thumb pain likely due to OA We did x-rays of her left thumb which showed OA a year ago Will get labs today Discussed immunizations  Discussed conservative care-   For your thumb- ok to use ice, heat, ibuprofen or naproxen, acetaminophen, and diclofenac gel If you would like to see hand orthopedics for possible injection or other treatment let me know!   This visit occurred during the SARS-CoV-2 public health emergency.  Safety protocols were in place, including screening questions prior to the visit, additional usage of staff PPE, and extensive cleaning of exam room while observing appropriate contact time as indicated for disinfecting solutions.   Signed Lamar Blinks, MD

## 2020-11-04 ENCOUNTER — Other Ambulatory Visit: Payer: Self-pay

## 2020-11-04 ENCOUNTER — Ambulatory Visit (INDEPENDENT_AMBULATORY_CARE_PROVIDER_SITE_OTHER): Payer: PPO | Admitting: Family Medicine

## 2020-11-04 VITALS — BP 112/74 | HR 67 | Temp 97.8°F | Resp 15 | Ht <= 58 in | Wt 116.0 lb

## 2020-11-04 DIAGNOSIS — Z13220 Encounter for screening for lipoid disorders: Secondary | ICD-10-CM

## 2020-11-04 DIAGNOSIS — G8929 Other chronic pain: Secondary | ICD-10-CM

## 2020-11-04 DIAGNOSIS — Z131 Encounter for screening for diabetes mellitus: Secondary | ICD-10-CM | POA: Diagnosis not present

## 2020-11-04 DIAGNOSIS — E538 Deficiency of other specified B group vitamins: Secondary | ICD-10-CM

## 2020-11-04 DIAGNOSIS — Z130 Encounter for screening for diseases of the blood and blood-forming organs and certain disorders involving the immune mechanism: Secondary | ICD-10-CM | POA: Diagnosis not present

## 2020-11-04 DIAGNOSIS — M79644 Pain in right finger(s): Secondary | ICD-10-CM | POA: Diagnosis not present

## 2020-11-04 DIAGNOSIS — Z23 Encounter for immunization: Secondary | ICD-10-CM

## 2020-11-04 NOTE — Patient Instructions (Addendum)
It was good to see you again today  I will be in touch with your labs For your thumb- ok to use ice, heat, ibuprofen or naproxen, acetaminophen, and diclofenac gel If you would like to see hand orthopedics for possible injection or other treatment let me know!   Recommend flu shot, new covid booster, shingles vaccine  Take care!

## 2020-11-05 ENCOUNTER — Encounter: Payer: Self-pay | Admitting: Family Medicine

## 2020-11-05 LAB — COMPREHENSIVE METABOLIC PANEL
ALT: 14 U/L (ref 0–35)
AST: 17 U/L (ref 0–37)
Albumin: 4.3 g/dL (ref 3.5–5.2)
Alkaline Phosphatase: 46 U/L (ref 39–117)
BUN: 13 mg/dL (ref 6–23)
CO2: 31 mEq/L (ref 19–32)
Calcium: 10 mg/dL (ref 8.4–10.5)
Chloride: 103 mEq/L (ref 96–112)
Creatinine, Ser: 0.81 mg/dL (ref 0.40–1.20)
GFR: 73.82 mL/min (ref >60.00)
Glucose, Bld: 81 mg/dL (ref 70–99)
Potassium: 4.7 mEq/L (ref 3.5–5.1)
Sodium: 140 mEq/L (ref 135–145)
Total Bilirubin: 0.8 mg/dL (ref 0.2–1.2)
Total Protein: 6.9 g/dL (ref 6.0–8.3)

## 2020-11-05 LAB — LIPID PANEL
Cholesterol: 245 mg/dL — ABNORMAL HIGH (ref 0–200)
HDL: 94.9 mg/dL (ref >39.00)
LDL Cholesterol: 133 mg/dL — ABNORMAL HIGH (ref 0–99)
NonHDL: 149.83
Total CHOL/HDL Ratio: 3
Triglycerides: 86 mg/dL (ref 0.0–149.0)
VLDL: 17.2 mg/dL (ref 0.0–40.0)

## 2020-11-05 LAB — CBC
HCT: 42.9 % (ref 36.0–46.0)
Hemoglobin: 14.5 g/dL (ref 12.0–15.0)
MCHC: 33.7 g/dL (ref 30.0–36.0)
MCV: 94.5 fl (ref 78.0–100.0)
Platelets: 239 10*3/uL (ref 150.0–400.0)
RBC: 4.54 Mil/uL (ref 3.87–5.11)
RDW: 13.8 % (ref 11.5–15.5)
WBC: 4.6 10*3/uL (ref 4.0–10.5)

## 2020-11-05 LAB — HEMOGLOBIN A1C: Hgb A1c MFr Bld: 5.4 % (ref 4.6–6.5)

## 2020-11-05 LAB — VITAMIN B12: Vitamin B-12: 512 pg/mL (ref 211–911)

## 2020-11-30 ENCOUNTER — Ambulatory Visit: Payer: PPO | Attending: Internal Medicine

## 2020-11-30 DIAGNOSIS — Z23 Encounter for immunization: Secondary | ICD-10-CM

## 2020-11-30 NOTE — Progress Notes (Signed)
   Covid-19 Vaccination Clinic  Name:  Ruth Vaughn    MRN: [id number] DOB: December 02, 1950  11/30/2020  [name] was observed post Covid-19 immunization for 15 minutes without incident. She was provided with Vaccine Information Sheet and instruction to access the V-Safe system.   [name] was instructed to call 911 with any severe reactions post vaccine: Difficulty breathing  Swelling of face and throat  A fast heartbeat  A bad rash all over body  Dizziness and weakness

## 2020-12-09 ENCOUNTER — Other Ambulatory Visit (HOSPITAL_BASED_OUTPATIENT_CLINIC_OR_DEPARTMENT_OTHER): Payer: Self-pay

## 2020-12-09 MED ORDER — MODERNA COVID-19 BIVAL BOOSTER 50 MCG/0.5ML IM SUSP
INTRAMUSCULAR | 0 refills | Status: DC
  Filled 2020-12-09: qty 0.5, 1d supply, fill #0

## 2021-02-16 NOTE — Progress Notes (Signed)
Subjective:   Ruth Vaughn is a 70 y.o. female who presents for Medicare Annual (Subsequent) preventive examination.  I connected with Ruth Vaughn today by telephone and verified that I am speaking with the correct person using two identifiers. Location patient: home Location provider: work Persons participating in the virtual visit: patient, Marine scientist.    I discussed the limitations, risks, security and privacy concerns of performing an evaluation and management service by telephone and the availability of in person appointments. I also discussed with the patient that there may be a patient responsible charge related to this service. The patient expressed understanding and verbally consented to this telephonic visit.    Interactive audio and video telecommunications were attempted between this provider and patient, however failed, due to patient having technical difficulties OR patient did not have access to video capability.  We continued and completed visit with audio only.  Some vital signs may be absent or patient reported.   Time Spent with patient on telephone encounter: 30 minutes    Review of Systems     Cardiac Risk Factors include: advanced age (>32men, >24 women)     Objective:    Today's Vitals   02/18/21 1342 02/18/21 1343  Weight: 116 lb (52.6 kg)   Height: 4\' 10"  (1.473 m)   PainSc:  5    Body mass index is 24.24 kg/m.  Advanced Directives 02/18/2021 06/03/2019 08/30/2016  Does Patient Have a Medical Advance Directive? Yes Yes Yes  Type of Paramedic of [place];Living will [place];Living will [place];Living will  Does patient want to make changes to medical advance directive? - No - Patient declined -  Copy of [place] in Chart? No - copy requested No - copy requested -    Current Medications (verified) Outpatient Encounter Medications as of 02/18/2021  Medication Sig    Cholecalciferol (VITAMIN D PO) Take by mouth.   Fish Oil OIL by Does not apply route.   Glucosamine HCl (GLUCOSAMINE PO) Take by mouth.   ibuprofen (ADVIL) 400 MG tablet Take 400 mg by mouth every 6 (six) hours as needed.   Multiple Vitamin (MULTI-VITAMIN DAILY PO) Take by mouth.   OVER THE COUNTER MEDICATION Curcum 2250 mg: Take 1 capsule daily   THEANINE PO Take by mouth as needed.   TURMERIC PO Take by mouth.   clonazePAM (KLONOPIN) 0.5 MG tablet Take 0.5-1 tablets (0.25-0.5 mg total) by mouth 3 (three) times daily as needed for anxiety. (Patient not taking: Reported on 11/04/2020)   COVID-19 mRNA bivalent vaccine, Moderna, (MODERNA COVID-19 BIVAL BOOSTER) 50 MCG/0.5ML injection Inject into the muscle. (Patient not taking: Reported on 02/18/2021)   fluticasone (FLONASE) 50 MCG/ACT nasal spray Place 2 sprays into both nostrils daily. (Patient not taking: Reported on 02/18/2021)   No facility-administered encounter medications on file as of 02/18/2021.    Allergies (verified) Codeine, Erythromycin, Penicillins, Povidone iodine, and Sulfa antibiotics   History: Past Medical History:  Diagnosis Date   Arthritis    Cataract    Fibromyalgia    Glaucoma    Osteoporosis    Past Surgical History:  Procedure Laterality Date   CERVICAL SPINE SURGERY     CESAREAN SECTION     EYE SURGERY     SPINE SURGERY     History reviewed. No pertinent family history. Social History   Socioeconomic History   Marital status: Divorced    Spouse name: Not on file   Number  of children: Not on file   Years of education: Not on file   Highest education level: Not on file  Occupational History   Not on file  Tobacco Use   Smoking status: Former   Smokeless tobacco: Never  Substance and Sexual Activity   Alcohol use: Yes    Comment: daily   Drug use: No   Sexual activity: Not on file  Other Topics Concern   Not on file  Social History Narrative   Not on file   Social Determinants of Health    Financial Resource Strain: Low Risk    Difficulty of Paying Living Expenses: Not hard at all  Food Insecurity: No Food Insecurity   Worried About [place] in the Last Year: Never true   [place] in the Last Year: Never true  Transportation Needs: No Transportation Needs   Lack of Transportation (Medical): No   Lack of Transportation (Non-Medical): No  Physical Activity: Insufficiently Active   Days of Exercise per Week: 7 days   Minutes of Exercise per Session: 20 min  Stress: No Stress Concern Present   Feeling of Stress : Only a little  Social Connections: Socially Isolated   Frequency of Communication with Friends and Family: More than three times a week   Frequency of Social Gatherings with Friends and Family: More than three times a week   Attends Religious Services: Never   Marine scientist or Organizations: No   Attends Music therapist: Never   Marital Status: Divorced    Tobacco Counseling Counseling given: Not Answered   Clinical Intake:  Pre-visit preparation completed: Yes  Pain : 0-10 Pain Score: 5  Pain Type: Acute pain (seen at [facility] today.) Pain Location: Knee Pain Orientation: Left Pain Onset: Yesterday Pain Frequency: Constant     BMI - recorded: 24.24 Nutritional Status: BMI of 19-24  Normal Nutritional Risks: None Diabetes: No  How often do you need to have someone help you when you read instructions, pamphlets, or other written materials from your doctor or pharmacy?: 1 - Never  Diabetic?No  Interpreter Needed?: No  Information entered by :: Caroleen Hamman LPN   Activities of Daily Living In your present state of health, do you have any difficulty performing the following activities: 02/18/2021  Hearing? N  Vision? N  Difficulty concentrating or making decisions? N  Walking or climbing stairs? N  Dressing or bathing? N  Doing errands, shopping? N  Preparing Food and eating ? N  Using  the Toilet? N  In the past six months, have you accidently leaked urine? Y  Do you have problems with loss of bowel control? N  Managing your Medications? N  Managing your Finances? N  Housekeeping or managing your Housekeeping? N  Some recent data might be hidden    Patient Care Team: Copland, Gay Filler, MD as PCP - General (Family Medicine) Monna Fam, MD as Consulting Physician (Ophthalmology)  Indicate any recent Medical Services you may have received from other than [facility] providers in the past year (date may be approximate).     Assessment:   This is a routine wellness examination for [place].  Hearing/Vision screen Hearing Screening - Comments:: C/o mild hearing loss Vision Screening - Comments:: Last eye exam-05/2020-Dr. Herbert Deaner  Dietary issues and exercise activities discussed: Current Exercise Habits: Home exercise routine, Type of exercise: walking, Time (Minutes): 20, Frequency (Times/Week): 7, Weekly Exercise (Minutes/Week): 140, Intensity: Mild, Exercise limited by: orthopedic  condition(s)   Goals Addressed             This Visit's Progress    DIET - INCREASE WATER INTAKE   On track      Depression Screen PHQ 2/9 Scores 02/18/2021 10/03/2019 06/03/2019  PHQ - 2 Score 0 1 0  PHQ- 9 Score - 6 -    Fall Risk Fall Risk  02/18/2021 04/30/2020 06/03/2019 09/24/2018  Falls in the past year? 0 0 0 (No Data)  Comment - - - Emmi Telephone Survey: data to providers prior to load  Number falls in past yr: 0 0 0 (No Data)  Comment - - - Emmi Telephone Survey Actual Response =   Injury with Fall? 0 0 0 -  Follow up Falls prevention discussed Falls evaluation completed Education provided;Falls prevention discussed -    FALL RISK PREVENTION PERTAINING TO THE HOME:  Any stairs in or around the home? No  Home free of loose throw rugs in walkways, pet beds, electrical cords, etc? Yes  Adequate lighting in your home to reduce risk of falls? Yes   ASSISTIVE DEVICES  UTILIZED TO PREVENT FALLS:  Life alert? No  Use of a cane, walker or w/c? No  Grab bars in the bathroom? Yes  Shower chair or bench in shower? No  Elevated toilet seat or a handicapped toilet? No   TIMED UP AND GO:  Was the test performed? No . Phone visit   Cognitive Function:Normal cognitive status assessed by this Nurse Health Advisor. No abnormalities found.          Immunizations Immunization History  Administered Date(s) Administered   [employer] Covid-19 Vaccine Bivalent Booster 67yrs & up 11/30/2020   Moderna Sars-Covid-2 Vaccination 03/21/2019, 04/18/2019   Td 04/30/2020    TDAP status: Up to date  Flu Vaccine status: Declined, Education has been provided regarding the importance of this vaccine but patient still declined. Advised may receive this vaccine at local pharmacy or Health Dept. Aware to provide a copy of the vaccination record if obtained from local pharmacy or Health Dept. Verbalized acceptance and understanding.  Pneumococcal vaccine status: Declined,  Education has been provided regarding the importance of this vaccine but patient still declined. Advised may receive this vaccine at local pharmacy or Health Dept. Aware to provide a copy of the vaccination record if obtained from local pharmacy or Health Dept. Verbalized acceptance and understanding.   Covid-19 vaccine status: Completed vaccines  Qualifies for Shingles Vaccine? Yes   Zostavax completed No   Shingrix Completed?: No.    Education has been provided regarding the importance of this vaccine. Patient has been advised to call insurance company to determine out of pocket expense if they have not yet received this vaccine. Advised may also receive vaccine at local pharmacy or Health Dept. Verbalized acceptance and understanding.  Screening Tests Health Maintenance  Topic Date Due   Zoster Vaccines- Shingrix (1 of 2) Never done   Pneumonia Vaccine 38+ Years old (1 - PCV) Never done   INFLUENZA  VACCINE  Never done   MAMMOGRAM  04/15/2021   Fecal DNA (Cologuard)  05/18/2022   TETANUS/TDAP  05/01/2030   DEXA SCAN  Completed   COVID-19 Vaccine  Completed   Hepatitis C Screening  Completed   HPV VACCINES  Aged Out    Health Maintenance  Health Maintenance Due  Topic Date Due   Zoster Vaccines- Shingrix (1 of 2) Never done   Pneumonia Vaccine 40+ Years old (56 -  PCV) Never done   INFLUENZA VACCINE  Never done    Colorectal cancer screening: Type of screening: Cologuard. Completed 05/18/2019. Repeat every 3 years  Mammogram status: Completed bilateral 04/16/2019. Repeat every year  Bone Density status: Completed 04/16/2019. Results reflect: Bone density results: OSTEOPENIA. Repeat every 2 years.  Lung Cancer Screening: (Low Dose CT Chest recommended if Age 48-80 years, 30 pack-year currently smoking OR have quit w/in 15years.) does not qualify.     Additional Screening:  Hepatitis C Screening: Completed 12/17/2015  Vision Screening: Recommended annual ophthalmology exams for early detection of glaucoma and other disorders of the eye. Is the patient up to date with their annual eye exam?  Yes  Who is the provider or what is the name of the office in which the patient attends annual eye exams? Dr. Herbert Deaner   Dental Screening: Recommended annual dental exams for proper oral hygiene  Community Resource Referral / Chronic Care Management: CRR required this visit?  No   CCM required this visit?  No      Plan:     I have personally reviewed and noted the following in the patients chart:   Medical and social history Use of alcohol, tobacco or illicit drugs  Current medications and supplements including opioid prescriptions.  Functional ability and status Nutritional status Physical activity Advanced directives List of other physicians Hospitalizations, surgeries, and ER visits in previous 12 months Vitals Screenings to include cognitive, depression, and  falls Referrals and appointments  In addition, I have reviewed and discussed with patient certain preventive protocols, quality metrics, and best practice recommendations. A written personalized care plan for preventive services as well as general preventive health recommendations were provided to patient.   Due to this being a telephonic visit, the after visit summary with patients personalized plan was offered to patient via mail or my-chart. Patient would like to access on my-chart.   Marta Antu, LPN   30/16/0109  Nurse Health Advisor  Nurse Notes: None

## 2021-02-18 ENCOUNTER — Ambulatory Visit (INDEPENDENT_AMBULATORY_CARE_PROVIDER_SITE_OTHER): Payer: PPO

## 2021-02-18 VITALS — Ht <= 58 in | Wt 116.0 lb

## 2021-02-18 DIAGNOSIS — M238X2 Other internal derangements of left knee: Secondary | ICD-10-CM | POA: Diagnosis not present

## 2021-02-18 DIAGNOSIS — Z0000 Encounter for general adult medical examination without abnormal findings: Secondary | ICD-10-CM | POA: Diagnosis not present

## 2021-02-18 DIAGNOSIS — M25562 Pain in left knee: Secondary | ICD-10-CM | POA: Diagnosis not present

## 2021-02-18 NOTE — Patient Instructions (Signed)
[name] , Thank you for taking time to complete  your Medicare Wellness Visit. I appreciate your ongoing commitment to your health goals. Please review the following plan we discussed and let me know if I can assist you in the future.   Screening recommendations/referrals: Colonoscopy: Completed Cologuard 05/18/2019-Dye 05/18/2022 Mammogram: Due-Declined today.  Bone Density: Completed 04/16/2019-Due 04/15/2021. Recommended yearly ophthalmology/optometry visit for glaucoma screening and checkup Recommended yearly dental visit for hygiene and checkup  Vaccinations: Influenza vaccine: Declined Pneumococcal vaccine: Declined Tdap vaccine: Up to date Shingles vaccine: Declined    Covid-19:Up to date  Advanced directives: Please bring a copy of Living Will and/or Healthcare Power of Attorney for your chart.   Conditions/risks identified: See problem list  Next appointment: Follow up in one year for your annual wellness visit    Preventive Care 65 Years and Older, Female Preventive care refers to lifestyle choices and visits with your health care provider that can promote health and wellness. What does preventive care include? A yearly physical exam. This is also called an annual well check. Dental exams once or twice a year. Routine eye exams. Ask your health care provider how often you should have your eyes checked. Personal lifestyle choices, including: Daily care of your teeth and gums. Regular physical activity. Eating a healthy diet. Avoiding tobacco and drug use. Limiting alcohol use. Practicing safe sex. Taking low-dose aspirin every day. Taking vitamin and mineral supplements as recommended by your health care provider. What happens during an annual well check? The services and screenings done by your health care provider during your annual well check will depend on your age, overall health, lifestyle risk factors, and family history of disease. Counseling  Your health  care provider may ask you questions about your: Alcohol use. Tobacco use. Drug use. Emotional well-being. Home and relationship well-being. Sexual activity. Eating habits. History of falls. Memory and ability to understand (cognition). Work and work Statistician. Reproductive health. Screening  You may have the following tests or measurements: Height, weight, and BMI. Blood pressure. Lipid and cholesterol levels. These may be checked every 5 years, or more frequently if you are over 55 years old. Skin check. Lung cancer screening. You may have this screening every year starting at age 63 if you have a 30-pack-year history of smoking and currently smoke or have quit within the past 15 years. Fecal occult blood test (FOBT) of the stool. You may have this test every year starting at age 43. Flexible sigmoidoscopy or colonoscopy. You may have a sigmoidoscopy every 5 years or a colonoscopy every 10 years starting at age 74. Hepatitis C blood test. Hepatitis B blood test. Sexually transmitted disease (STD) testing. Diabetes screening. This is done by checking your blood sugar (glucose) after you have not eaten for a while (fasting). You may have this done every 1-3 years. Bone density scan. This is done to screen for osteoporosis. You may have this done starting at age 90. Mammogram. This may be done every 1-2 years. Talk to your health care provider about how often you should have regular mammograms. Talk with your health care provider about your test results, treatment options, and if necessary, the need for more tests. Vaccines  Your health care provider may recommend certain vaccines, such as: Influenza vaccine. This is recommended every year. Tetanus, diphtheria, and acellular pertussis (Tdap, Td) vaccine. You may need a Td booster every 10 years. Zoster vaccine. You may need this after age 78. Pneumococcal 13-valent conjugate (PCV13) vaccine. One  dose is recommended after age  23. Pneumococcal polysaccharide (PPSV23) vaccine. One dose is recommended after age 14. Talk to your health care provider about which screenings and vaccines you need and how often you need them. This information is not intended to replace advice given to you by your health care provider. Make sure you discuss any questions you have with your health care provider. Document Released: 03/06/2015 Document Revised: 10/28/2015 Document Reviewed: 12/09/2014 Elsevier Interactive Patient Education  2017 [employer] Prevention in the Home Falls can cause injuries. They can happen to people of all ages. There are many things you can do to make your home safe and to help prevent falls. What can I do on the outside of my home? Regularly fix the edges of walkways and driveways and fix any cracks. Remove anything that might make you trip as you walk through a door, such as a raised step or threshold. Trim any bushes or trees on the path to your home. Use bright outdoor lighting. Clear any walking paths of anything that might make someone trip, such as rocks or tools. Regularly check to see if handrails are loose or broken. Make sure that both sides of any steps have handrails. Any raised decks and porches should have guardrails on the edges. Have any leaves, snow, or ice cleared regularly. Use sand or salt on walking paths during winter. Clean up any spills in your garage right away. This includes oil or grease spills. What can I do in the bathroom? Use night lights. Install grab bars by the toilet and in the tub and shower. Do not use towel bars as grab bars. Use non-skid mats or decals in the tub or shower. If you need to sit down in the shower, use a plastic, non-slip stool. Keep the floor dry. Clean up any water that spills on the floor as soon as it happens. Remove soap buildup in the tub or shower regularly. Attach bath mats securely with double-sided non-slip rug tape. Do not have throw  rugs and other things on the floor that can make you trip. What can I do in the bedroom? Use night lights. Make sure that you have a light by your bed that is easy to reach. Do not use any sheets or blankets that are too big for your bed. They should not hang down onto the floor. Have a firm chair that has side arms. You can use this for support while you get dressed. Do not have throw rugs and other things on the floor that can make you trip. What can I do in the kitchen? Clean up any spills right away. Avoid walking on wet floors. Keep items that you use a lot in easy-to-reach places. If you need to reach something above you, use a strong step stool that has a grab bar. Keep electrical cords out of the way. Do not use floor polish or wax that makes floors slippery. If you must use wax, use non-skid floor wax. Do not have throw rugs and other things on the floor that can make you trip. What can I do with my stairs? Do not leave any items on the stairs. Make sure that there are handrails on both sides of the stairs and use them. Fix handrails that are broken or loose. Make sure that handrails are as long as the stairways. Check any carpeting to make sure that it is firmly attached to the stairs. Fix any carpet that is loose or worn.  Avoid having throw rugs at the top or bottom of the stairs. If you do have throw rugs, attach them to the floor with carpet tape. Make sure that you have a light switch at the top of the stairs and the bottom of the stairs. If you do not have them, ask someone to add them for you. What else can I do to help prevent falls? Wear shoes that: Do not have high heels. Have rubber bottoms. Are comfortable and fit you well. Are closed at the toe. Do not wear sandals. If you use a stepladder: Make sure that it is fully opened. Do not climb a closed stepladder. Make sure that both sides of the stepladder are locked into place. Ask someone to hold it for you, if  possible. Clearly mark and make sure that you can see: Any grab bars or handrails. First and last steps. Where the edge of each step is. Use tools that help you move around (mobility aids) if they are needed. These include: Canes. Walkers. Scooters. Crutches. Turn on the lights when you go into a dark area. Replace any light bulbs as soon as they burn out. Set up your furniture so you have a clear path. Avoid moving your furniture around. If any of your floors are uneven, fix them. If there are any pets around you, be aware of where they are. Review your medicines with your doctor. Some medicines can make you feel dizzy. This can increase your chance of falling. Ask your doctor what other things that you can do to help prevent falls. This information is not intended to replace advice given to you by your health care provider. Make sure you discuss any questions you have with your health care provider. Document Released: 12/04/2008 Document Revised: 07/16/2015 Document Reviewed: 03/14/2014 Elsevier Interactive Patient Education  2017 [employer].

## 2021-02-25 DIAGNOSIS — S82102A Unspecified fracture of upper end of left tibia, initial encounter for closed fracture: Secondary | ICD-10-CM | POA: Diagnosis not present

## 2021-03-11 DIAGNOSIS — S82145S Nondisplaced bicondylar fracture of left tibia, sequela: Secondary | ICD-10-CM | POA: Diagnosis not present

## 2021-04-01 DIAGNOSIS — S82145S Nondisplaced bicondylar fracture of left tibia, sequela: Secondary | ICD-10-CM | POA: Diagnosis not present

## 2021-04-16 ENCOUNTER — Encounter: Payer: Self-pay | Admitting: Family Medicine

## 2021-04-16 ENCOUNTER — Other Ambulatory Visit: Payer: PPO

## 2021-04-16 ENCOUNTER — Telehealth: Payer: Self-pay | Admitting: Family Medicine

## 2021-04-16 DIAGNOSIS — R300 Dysuria: Secondary | ICD-10-CM

## 2021-04-16 MED ORDER — NITROFURANTOIN MONOHYD MACRO 100 MG PO CAPS: 100.0000 mg | ORAL_CAPSULE | Freq: Two times a day (BID) | ORAL | 0 refills | Status: DC

## 2021-04-16 NOTE — Telephone Encounter (Signed)
Patient states she has an UTI and would like to have medication sent to her pharmacy. Se states she has burning when peeping, pressure, and frequency of urination. She was advised to make an OV for an urine sample but she declined and stated medication has been sent in for her before. Please advise.

## 2021-04-16 NOTE — Telephone Encounter (Signed)
Called and spoke with patient.  She has mild, typical UTI symptoms.  I advised her we would need to get a urine culture so we can ensure proper treatment.  She can come to clinic now and drop off a sample.  Unfortunately we cannot get her a visit right now as it is 330 on Friday afternoon  Called in [place] to her pharmacy

## 2021-04-16 NOTE — Telephone Encounter (Signed)
Mychart message sent to Dr Lorelei Pont.

## 2021-04-17 LAB — URINE CULTURE
MICRO NUMBER:: 13054547
SPECIMEN QUALITY:: ADEQUATE

## 2021-04-18 ENCOUNTER — Encounter: Payer: Self-pay | Admitting: Family Medicine

## 2021-05-17 IMAGING — MG DIGITAL SCREENING BILAT W/ TOMO W/ CAD
8 series · 8 of 24 positions shown · non-contrast
Comparison: Previous exam(s).

CLINICAL DATA: Screening.

EXAM:
DIGITAL SCREENING BILATERAL MAMMOGRAM WITH TOMO AND CAD

[L CC synth-2D]
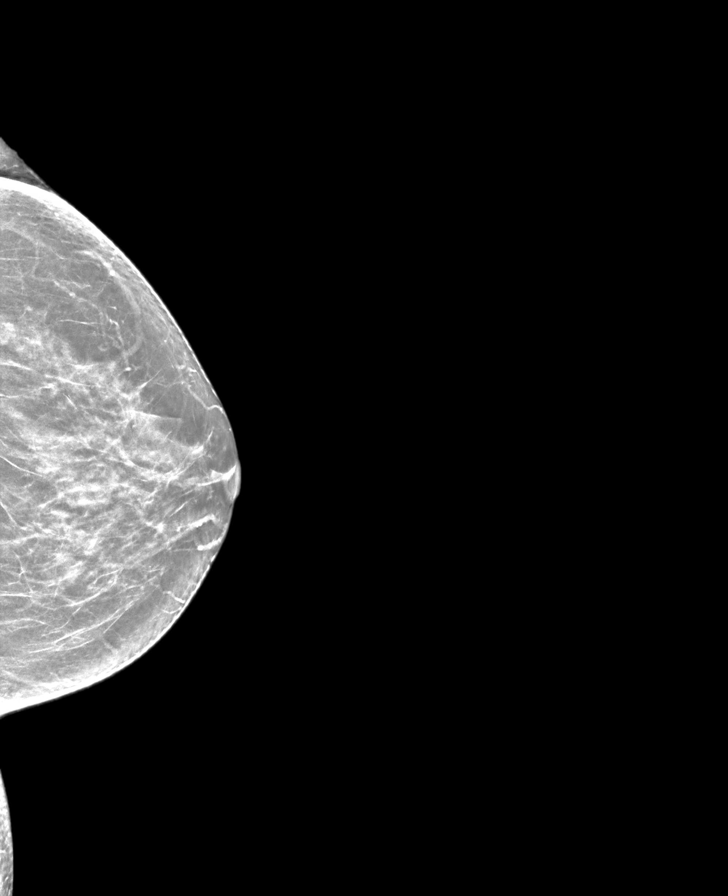

[L MLO synth-2D]
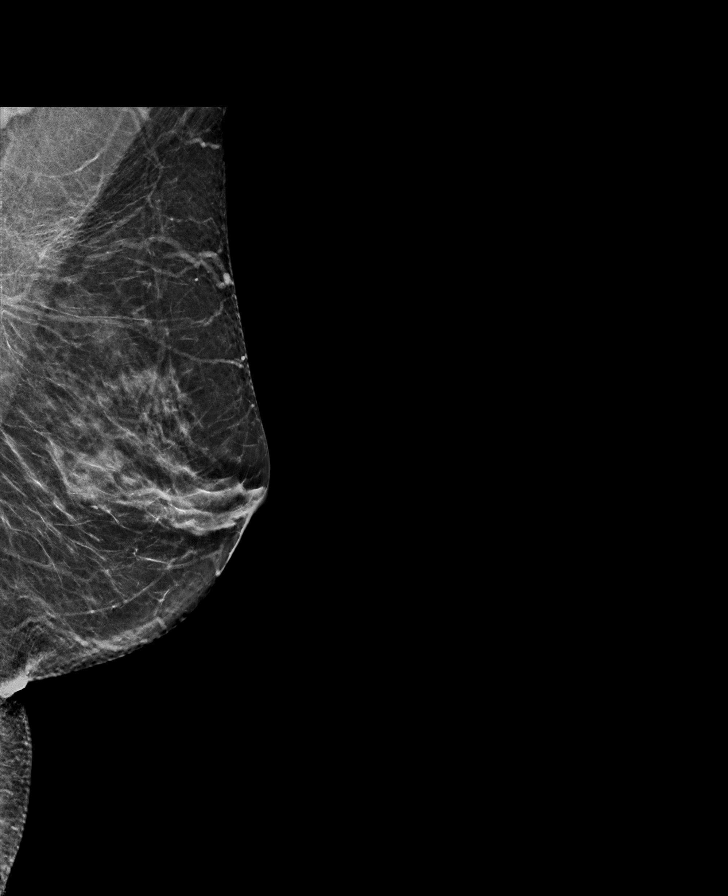

[R MLO synth-2D]
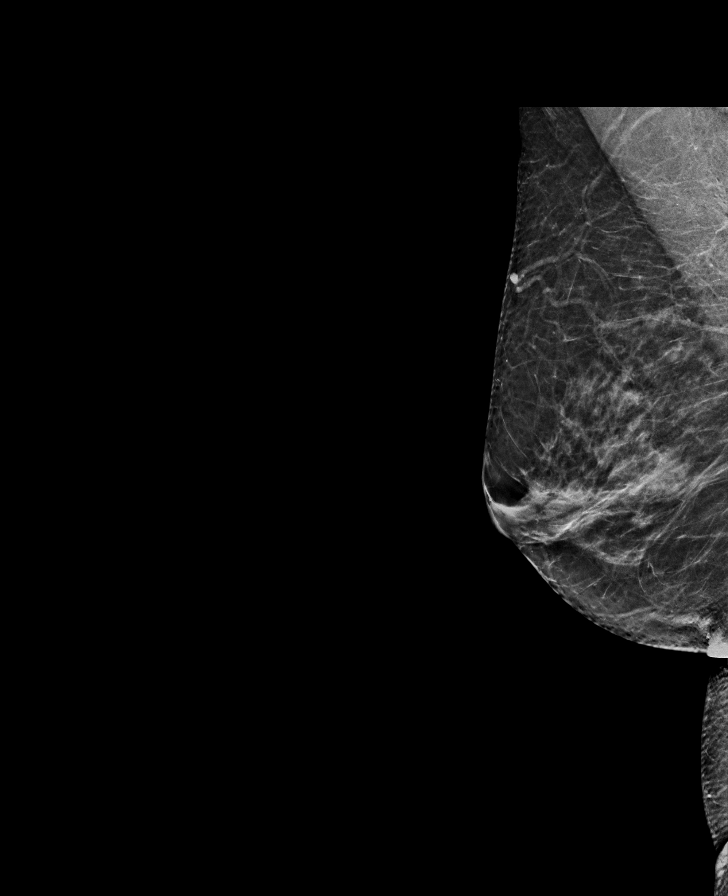

[R CC synth-2D]
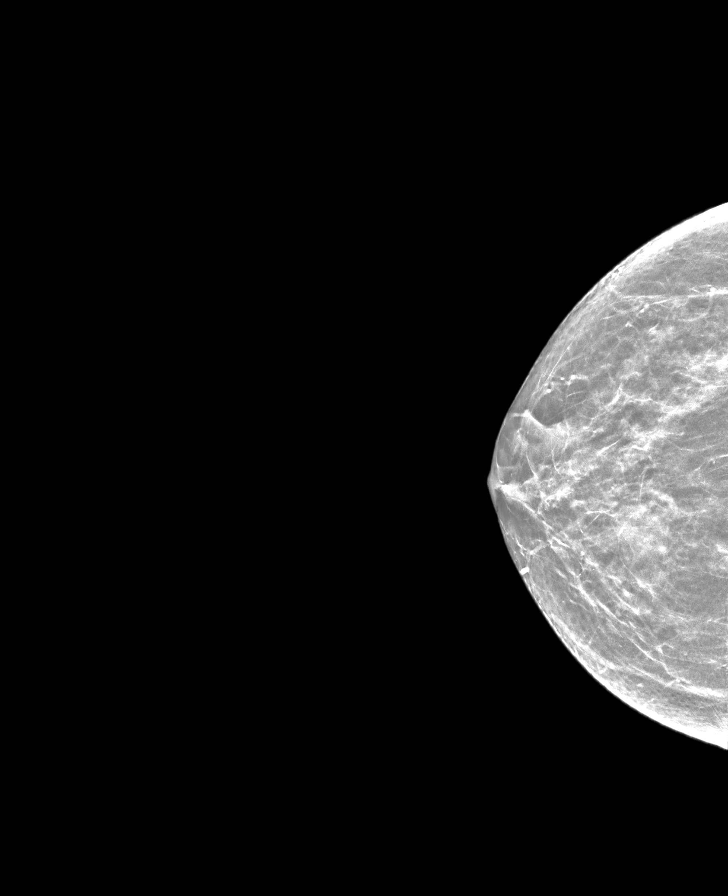

[R CC tomo · tomo slice 27/52.0]
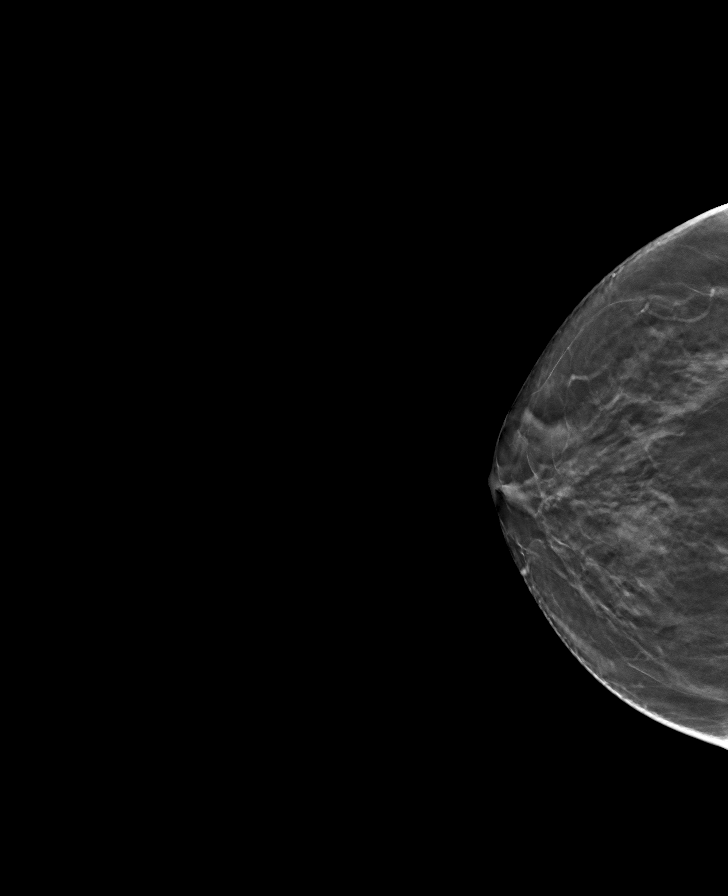

[L MLO tomo · tomo slice 31/61.0]
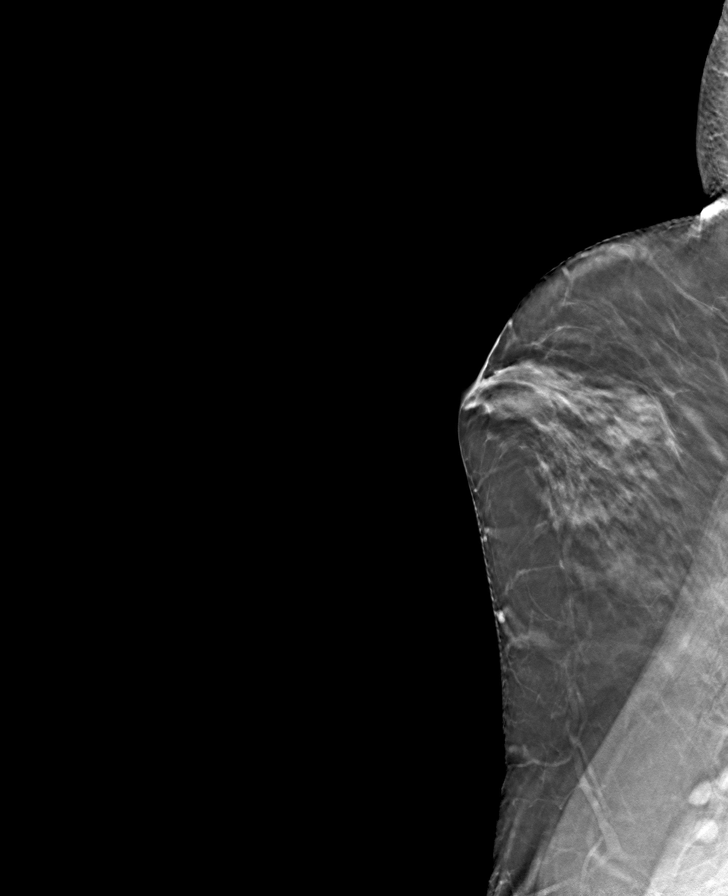

[R MLO tomo · tomo slice 29/57.0]
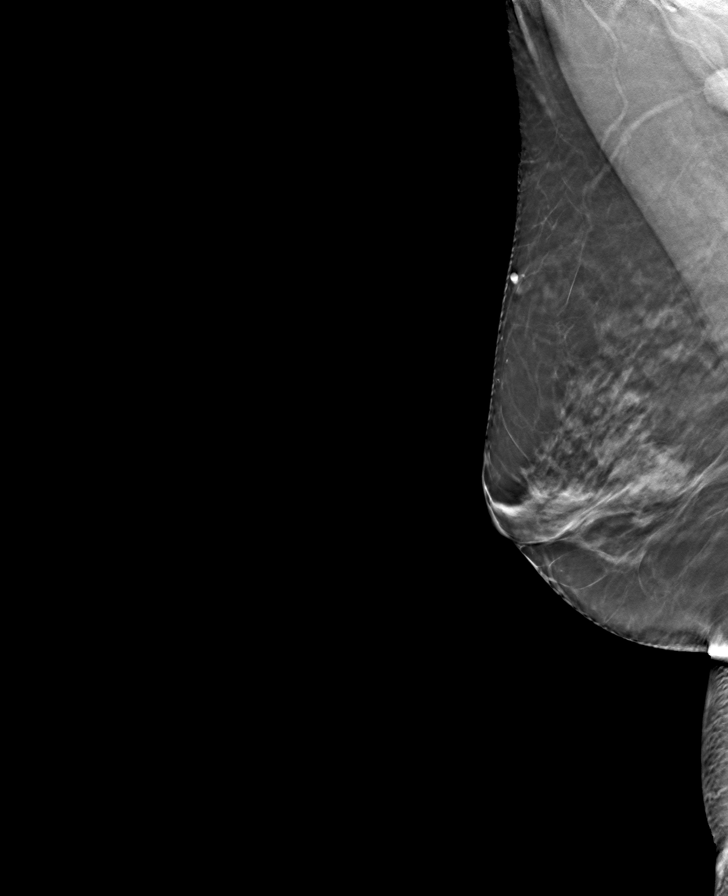

[L CC tomo · tomo slice 30/59.0]
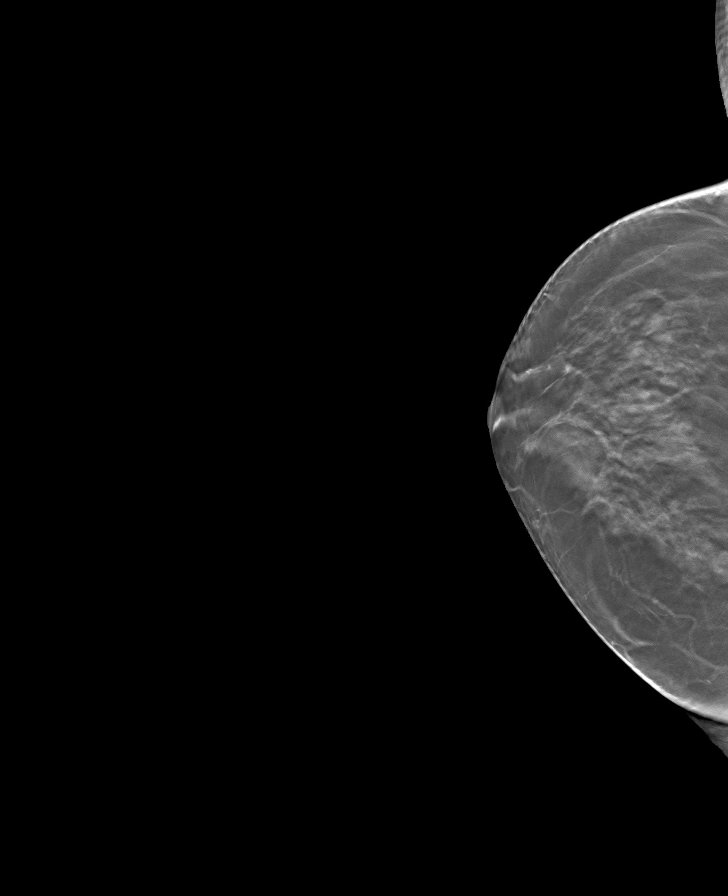

[8 of 24 positions shown; findings below may reference images not displayed]

ACR Breast Density Category c: The breast tissue is heterogeneously
dense, which may obscure small masses.
FINDINGS: There are no findings suspicious for malignancy. Images were
processed with CAD.
IMPRESSION: No mammographic evidence of malignancy. A result letter of this
screening mammogram will be mailed directly to the patient.

RECOMMENDATION:
Screening mammogram in one year. (Code:[id number])

BI-RADS CATEGORY  1: Negative.

## 2021-05-27 ENCOUNTER — Encounter: Payer: Self-pay | Admitting: Family Medicine

## 2021-06-24 ENCOUNTER — Encounter: Payer: Self-pay | Admitting: Family Medicine

## 2021-07-27 DIAGNOSIS — H18593 Other hereditary corneal dystrophies, bilateral: Secondary | ICD-10-CM | POA: Diagnosis not present

## 2021-07-27 DIAGNOSIS — H401131 Primary open-angle glaucoma, bilateral, mild stage: Secondary | ICD-10-CM | POA: Diagnosis not present

## 2021-07-27 DIAGNOSIS — H04123 Dry eye syndrome of bilateral lacrimal glands: Secondary | ICD-10-CM | POA: Diagnosis not present

## 2021-07-27 DIAGNOSIS — H524 Presbyopia: Secondary | ICD-10-CM | POA: Diagnosis not present

## 2021-07-27 DIAGNOSIS — H26491 Other secondary cataract, right eye: Secondary | ICD-10-CM | POA: Diagnosis not present

## 2021-11-04 ENCOUNTER — Encounter: Payer: Self-pay | Admitting: Family Medicine

## 2021-11-04 DIAGNOSIS — K30 Functional dyspepsia: Secondary | ICD-10-CM

## 2021-11-14 NOTE — Progress Notes (Unsigned)
Ruth Vaughn at [facility] [address], [place], [place] [address] [phone]  Date:  11/17/2021   Name:  [name]   DOB:  08-15-1950   MRN:  [id number]  PCP:  Darreld Mclean, MD    Chief Complaint: No chief complaint on file.   History of Present Illness:  Ruth Vaughn is a 71 y.o. very pleasant female patient who presents with the following:  Patient seen today for concern of GI discomfort- History of osteopenia, B12 and vitamin D deficiency, glaucoma Most recent visit with myself was about 1 year ago She contacted [place] recently about some bleeding with bowel movements, I made a referral to gastroenterology for her  Shingrix Pneumonia vaccine COVID booster Mammogram Flu shot Cologuard is up-to-date  Patient Active Problem List   Diagnosis Date Noted   Microscopic hematuria 10/14/2019   Osteopenia 04/17/2019   Right shoulder pain 07/11/2017   Vitamin D deficiency 01/10/2017   Vitamin B12 deficiency 07/25/2016   Depression, recurrent ([place]) 07/25/2016    Past Medical History:  Diagnosis Date   Arthritis    Cataract    Fibromyalgia    Glaucoma    Osteoporosis     Past Surgical History:  Procedure Laterality Date   CERVICAL SPINE SURGERY     CESAREAN SECTION     EYE SURGERY     SPINE SURGERY      Social History   Tobacco Use   Smoking status: Former   Smokeless tobacco: Never  Substance Use Topics   Alcohol use: Yes    Comment: daily   Drug use: No    No family history on file.  Allergies  Allergen Reactions   Codeine Swelling   Erythromycin    Penicillins Rash   Povidone Iodine Dermatitis and Rash   Sulfa Antibiotics Swelling and Rash    Medication list has been reviewed and updated.  Current Outpatient Medications on File Prior to Visit  Medication Sig Dispense Refill   Cholecalciferol (VITAMIN D PO) Take by mouth.     clonazePAM (KLONOPIN) 0.5 MG tablet Take 0.5-1 tablets  (0.25-0.5 mg total) by mouth 3 (three) times daily as needed for anxiety. (Patient not taking: Reported on 11/04/2020) 30 tablet 1   COVID-19 mRNA bivalent vaccine, Moderna, (MODERNA COVID-19 BIVAL BOOSTER) 50 MCG/0.5ML injection Inject into the muscle. (Patient not taking: Reported on 02/18/2021) 0.5 mL 0   Fish Oil OIL by Does not apply route.     fluticasone (FLONASE) 50 MCG/ACT nasal spray Place 2 sprays into both nostrils daily. (Patient not taking: Reported on 02/18/2021) 16 g 2   Glucosamine HCl (GLUCOSAMINE PO) Take by mouth.     ibuprofen (ADVIL) 400 MG tablet Take 400 mg by mouth every 6 (six) hours as needed.     Multiple Vitamin (MULTI-VITAMIN DAILY PO) Take by mouth.     nitrofurantoin, macrocrystal-monohydrate, (MACROBID) 100 MG capsule Take 1 capsule (100 mg total) by mouth 2 (two) times daily. 14 capsule 0   OVER THE COUNTER MEDICATION Curcum 2250 mg: Take 1 capsule daily     THEANINE PO Take by mouth as needed.     TURMERIC PO Take by mouth.     No current facility-administered medications on file prior to visit.    Review of Systems:  As per HPI- otherwise negative.   Physical Examination: There were no vitals filed for this visit. There were no vitals filed for this visit.  There is no height or weight on file to calculate BMI. Ideal Body Weight:    GEN: no acute distress. HEENT: Atraumatic, Normocephalic.  Ears and Nose: No external deformity. CV: RRR, No M/G/R. No JVD. No thrill. No extra heart sounds. PULM: CTA B, no wheezes, crackles, rhonchi. No retractions. No resp. distress. No accessory muscle use. ABD: S, NT, ND, +BS. No rebound. No HSM. EXTR: No c/c/e PSYCH: Normally interactive. Conversant.    Assessment and Plan: ***  Signed Lamar Blinks, MD

## 2021-11-17 ENCOUNTER — Encounter: Payer: Self-pay | Admitting: Family Medicine

## 2021-11-17 ENCOUNTER — Ambulatory Visit (INDEPENDENT_AMBULATORY_CARE_PROVIDER_SITE_OTHER): Payer: PPO | Admitting: Family Medicine

## 2021-11-17 VITALS — BP 118/70 | HR 63 | Temp 97.7°F | Resp 18 | Ht <= 58 in | Wt 120.2 lb

## 2021-11-17 DIAGNOSIS — R140 Abdominal distension (gaseous): Secondary | ICD-10-CM | POA: Diagnosis not present

## 2021-11-17 DIAGNOSIS — G4700 Insomnia, unspecified: Secondary | ICD-10-CM | POA: Diagnosis not present

## 2021-11-17 DIAGNOSIS — Z1231 Encounter for screening mammogram for malignant neoplasm of breast: Secondary | ICD-10-CM

## 2021-11-17 DIAGNOSIS — E2839 Other primary ovarian failure: Secondary | ICD-10-CM | POA: Diagnosis not present

## 2021-11-17 DIAGNOSIS — Z131 Encounter for screening for diabetes mellitus: Secondary | ICD-10-CM

## 2021-11-17 DIAGNOSIS — Z13220 Encounter for screening for lipoid disorders: Secondary | ICD-10-CM

## 2021-11-17 DIAGNOSIS — E538 Deficiency of other specified B group vitamins: Secondary | ICD-10-CM

## 2021-11-17 DIAGNOSIS — R194 Change in bowel habit: Secondary | ICD-10-CM | POA: Diagnosis not present

## 2021-11-17 LAB — CBC
HCT: 44.5 % (ref 36.0–46.0)
Hemoglobin: 15 g/dL (ref 12.0–15.0)
MCHC: 33.6 g/dL (ref 30.0–36.0)
MCV: 94.1 fl (ref 78.0–100.0)
Platelets: 243 10*3/uL (ref 150.0–400.0)
RBC: 4.73 Mil/uL (ref 3.87–5.11)
RDW: 13.9 % (ref 11.5–15.5)
WBC: 4.5 10*3/uL (ref 4.0–10.5)

## 2021-11-17 LAB — VITAMIN B12: Vitamin B-12: 759 pg/mL (ref 211–911)

## 2021-11-17 LAB — COMPREHENSIVE METABOLIC PANEL
ALT: 13 U/L (ref 0–35)
AST: 19 U/L (ref 0–37)
Albumin: 4.3 g/dL (ref 3.5–5.2)
Alkaline Phosphatase: 53 U/L (ref 39–117)
BUN: 11 mg/dL (ref 6–23)
CO2: 30 mEq/L (ref 19–32)
Calcium: 9.8 mg/dL (ref 8.4–10.5)
Chloride: 105 mEq/L (ref 96–112)
Creatinine, Ser: 0.8 mg/dL (ref 0.40–1.20)
GFR: 74.38 mL/min (ref >60.00)
Glucose, Bld: 96 mg/dL (ref 70–99)
Potassium: 5.1 mEq/L (ref 3.5–5.1)
Sodium: 141 mEq/L (ref 135–145)
Total Bilirubin: 0.8 mg/dL (ref 0.2–1.2)
Total Protein: 7.2 g/dL (ref 6.0–8.3)

## 2021-11-17 LAB — LIPID PANEL
Cholesterol: 230 mg/dL — ABNORMAL HIGH (ref 0–200)
HDL: 86.6 mg/dL (ref >39.00)
LDL Cholesterol: 131 mg/dL — ABNORMAL HIGH (ref 0–99)
NonHDL: 143.41
Total CHOL/HDL Ratio: 3
Triglycerides: 62 mg/dL (ref 0.0–149.0)
VLDL: 12.4 mg/dL (ref 0.0–40.0)

## 2021-11-17 LAB — HEMOGLOBIN A1C: Hgb A1c MFr Bld: 5.4 % (ref 4.6–6.5)

## 2021-11-17 LAB — TSH: TSH: 1.32 u[IU]/mL (ref 0.35–5.50)

## 2021-11-17 MED ORDER — TRAZODONE HCL 50 MG PO TABS: 25.0000 mg | ORAL_TABLET | Freq: Every evening | ORAL | 3 refills | Status: DC | PRN

## 2021-11-17 NOTE — Patient Instructions (Signed)
Good to see you today- I will be in touch with your labs asap  Ordered mammo and bone density- can stop by imaging on the ground floor to schedule!  Will set up a Ct scan of your abdomen and pelvis to be done here at [facility] on [address] Please let me know if any change or worsening of your symptoms in the meantime Recommend- shingles vaccine, pneumonia vaccine, covid shot and flu shot this fall!

## 2021-11-18 ENCOUNTER — Encounter: Payer: Self-pay | Admitting: Internal Medicine

## 2021-11-23 ENCOUNTER — Encounter (HOSPITAL_BASED_OUTPATIENT_CLINIC_OR_DEPARTMENT_OTHER): Payer: Self-pay

## 2021-11-23 ENCOUNTER — Ambulatory Visit (HOSPITAL_BASED_OUTPATIENT_CLINIC_OR_DEPARTMENT_OTHER)
Admission: RE | Admit: 2021-11-23 | Discharge: 2021-11-23 | Disposition: A | Discharge status: Home / Self Care | Payer: PPO | Source: Ambulatory Visit | Attending: Family Medicine | Admitting: Family Medicine

## 2021-11-23 ENCOUNTER — Encounter: Payer: Self-pay | Admitting: Family Medicine

## 2021-11-23 DIAGNOSIS — Z1231 Encounter for screening mammogram for malignant neoplasm of breast: Secondary | ICD-10-CM | POA: Insufficient documentation

## 2021-11-23 DIAGNOSIS — E2839 Other primary ovarian failure: Secondary | ICD-10-CM | POA: Insufficient documentation

## 2021-11-23 DIAGNOSIS — M810 Age-related osteoporosis without current pathological fracture: Secondary | ICD-10-CM | POA: Diagnosis not present

## 2021-12-10 ENCOUNTER — Ambulatory Visit
Admission: RE | Admit: 2021-12-10 | Discharge: 2021-12-10 | Disposition: A | Discharge status: Home / Self Care | Payer: PPO | Source: Ambulatory Visit | Attending: Family Medicine | Admitting: Family Medicine

## 2021-12-10 DIAGNOSIS — R197 Diarrhea, unspecified: Secondary | ICD-10-CM | POA: Diagnosis not present

## 2021-12-10 DIAGNOSIS — R140 Abdominal distension (gaseous): Secondary | ICD-10-CM | POA: Diagnosis not present

## 2021-12-10 DIAGNOSIS — R1084 Generalized abdominal pain: Secondary | ICD-10-CM | POA: Diagnosis not present

## 2021-12-10 DIAGNOSIS — R194 Change in bowel habit: Secondary | ICD-10-CM

## 2021-12-10 DIAGNOSIS — K7689 Other specified diseases of liver: Secondary | ICD-10-CM | POA: Diagnosis not present

## 2021-12-10 MED ORDER — IOPAMIDOL (ISOVUE-300) INJECTION 61%
100.0000 mL | Freq: Once | INTRAVENOUS | Status: AC | PRN
  Administered 2021-12-10: 100 mL via INTRAVENOUS

## 2021-12-14 ENCOUNTER — Encounter: Payer: Self-pay | Admitting: Family Medicine

## 2022-01-27 DIAGNOSIS — H401131 Primary open-angle glaucoma, bilateral, mild stage: Secondary | ICD-10-CM | POA: Diagnosis not present

## 2022-02-09 ENCOUNTER — Encounter: Payer: Self-pay | Admitting: Internal Medicine

## 2022-02-09 ENCOUNTER — Ambulatory Visit: Payer: PPO | Admitting: Internal Medicine

## 2022-02-09 VITALS — BP 106/72 | HR 71 | Ht 59.0 in | Wt 123.1 lb

## 2022-02-09 DIAGNOSIS — R194 Change in bowel habit: Secondary | ICD-10-CM | POA: Diagnosis not present

## 2022-02-09 DIAGNOSIS — K625 Hemorrhage of anus and rectum: Secondary | ICD-10-CM

## 2022-02-09 DIAGNOSIS — R151 Fecal smearing: Secondary | ICD-10-CM | POA: Diagnosis not present

## 2022-02-09 MED ORDER — PLENVU 140 G PO SOLR: 1.0000 | ORAL | 0 refills | Status: DC

## 2022-02-09 NOTE — Patient Instructions (Signed)
You have been scheduled for a colonoscopy. Please follow written instructions given to you at your visit today.  Please pick up your prep supplies at the pharmacy within the next 1-3 days. If you use inhalers (even only as needed), please bring them with you on the day of your procedure.  _______________________________________________________  If you are age 71 or older, your body mass index should be between 23-30. Your Body mass index is 24.87 kg/m. If this is out of the aforementioned range listed, please consider follow up with your Primary Care Provider.  If you are age 53 or younger, your body mass index should be between 19-25. Your Body mass index is 24.87 kg/m. If this is out of the aformentioned range listed, please consider follow up with your Primary Care Provider.   ________________________________________________________  The [facility] providers would like to encourage you to use [facility] to communicate with providers for non-urgent requests or questions.  Due to long hold times on the telephone, sending your provider a message by [facility] may be a faster and more efficient way to get a response.  Please allow 48 business hours for a response.  Please remember that this is for non-urgent requests.  _______________________________________________________  Due to recent changes in healthcare laws, you may see the results of your imaging and laboratory studies on MyChart before your provider has had a chance to review them.  We understand that in some cases there may be results that are confusing or concerning to you. Not all laboratory results come back in the same time frame and the provider may be waiting for multiple results in order to interpret others.  Please give [place] 48 hours in order for your provider to thoroughly review all the results before contacting the office for clarification of your results.

## 2022-02-09 NOTE — Progress Notes (Signed)
Patient ID: LYRICAL SOWLE, female   DOB: September 18, 1950, 71 y.o.   MRN: [id number] HPI: Ruth Vaughn is a 71 year old female with a history of osteoporosis, kidney stones and arthritis who is seen in consult at the request of Dr. Lorelei Pont to evaluate change in bowel habit and blood with stool.  She is here alone today.  She reports since March of this year she has developed occasional red blood with wiping and with stool.  She noticed it just several days ago after carrying firewood.  She also reports a change in her bowel movements.  Bowel movements have been more incomplete though rarely hard.  At times thinner than normal.  Difficult to clean after bowel movement.  Minor fecal incontinence and smearing after bowel movement forcing her to wear a pad.  She is also noticed some increasing gas and urgency.  Occasional lower abdominal pain.  No nausea or vomiting.  Good appetite.  10 pound weight gain.  No unexpected weight loss.  Symptoms seem to be most noticeable in March after she was treated with antibiotics for UTI.  No new medicines.  No family history of colorectal cancer.  She is divorced.  2 adult children.  Retired.  Past tobacco use but none now.  No prior colonoscopy  She reports a negative Cologuard in February 2021  Past Medical History:  Diagnosis Date   Arthritis    Cataract    Fibromyalgia    Glaucoma    Kidney stones    Osteoporosis     Past Surgical History:  Procedure Laterality Date   CERVICAL SPINE SURGERY     CESAREAN SECTION     EYE SURGERY     SPINE SURGERY      Outpatient Medications Prior to Visit  Medication Sig Dispense Refill   Cholecalciferol (VITAMIN D PO) Take by mouth.     Glucosamine HCl (GLUCOSAMINE PO) Take by mouth.     Multiple Vitamin (MULTI-VITAMIN DAILY PO) Take by mouth.     nitrofurantoin, macrocrystal-monohydrate, (MACROBID) 100 MG capsule Take 1 capsule (100 mg total) by mouth 2 (two) times daily. 14 capsule 0   OVER THE COUNTER MEDICATION  Curcum 2250 mg: Take 1 capsule daily     THEANINE PO Take by mouth as needed.     TURMERIC PO Take by mouth.     traZODone (DESYREL) 50 MG tablet Take 0.5-1 tablets (25-50 mg total) by mouth at bedtime as needed for sleep. 30 tablet 3   No facility-administered medications prior to visit.    Allergies  Allergen Reactions   Codeine Swelling   Erythromycin    Penicillins Rash   Povidone Iodine Dermatitis and Rash   Sulfa Antibiotics Swelling and Rash    Family History  Problem Relation Age of Onset   Diabetes Father    Prostate cancer Father    Prostate cancer Sister    Prostate cancer Brother    Breast cancer Paternal Grandmother     Social History   Tobacco Use   Smoking status: Former   Smokeless tobacco: Never  Scientific laboratory technician Use: Never used  Substance Use Topics   Alcohol use: Yes    Comment: daily   Drug use: No    ROS: As per history of present illness, otherwise negative  BP 106/72   Pulse 71   Ht '4\' 11"'$  (1.499 m)   Wt 123 lb 2 oz (55.8 kg)   BMI 24.87 kg/m  Gen: awake, alert, NAD HEENT:  anicteric, op clear CV: RRR, no mrg Pulm: CTA b/l Abd: soft, NT/ND, +BS throughout Ext: no c/c/e Neuro: nonfocal   RELEVANT LABS AND IMAGING: CBC    Component Value Date/Time   WBC 4.5 11/17/2021 1149   RBC 4.73 11/17/2021 1149   HGB 15.0 11/17/2021 1149   HCT 44.5 11/17/2021 1149   PLT 243.0 11/17/2021 1149   MCV 94.1 11/17/2021 1149   MCV 92.3 06/13/2011 1431   MCH 31.4 08/30/2016 1214   MCHC 33.6 11/17/2021 1149   RDW 13.9 11/17/2021 1149    CMP     Component Value Date/Time   NA 141 11/17/2021 1149   K 5.1 11/17/2021 1149   CL 105 11/17/2021 1149   CO2 30 11/17/2021 1149   GLUCOSE 96 11/17/2021 1149   BUN 11 11/17/2021 1149   CREATININE 0.80 11/17/2021 1149   CALCIUM 9.8 11/17/2021 1149   PROT 7.2 11/17/2021 1149   ALBUMIN 4.3 11/17/2021 1149   AST 19 11/17/2021 1149   ALT 13 11/17/2021 1149   ALKPHOS 53 11/17/2021 1149   BILITOT  0.8 11/17/2021 1149   GFRNONAA >60 08/30/2016 1214   GFRAA >60 08/30/2016 1214  CT ABDOMEN AND PELVIS WITH CONTRAST   TECHNIQUE: Multidetector CT imaging of the abdomen and pelvis was performed using the standard protocol following bolus administration of intravenous contrast.   RADIATION DOSE REDUCTION: This exam was performed according to the departmental dose-optimization program which includes automated exposure control, adjustment of the mA and/or kV according to patient size and/or use of iterative reconstruction technique.   CONTRAST:  140m ISOVUE-300 IOPAMIDOL (ISOVUE-300) INJECTION 61%   COMPARISON:  Noncontrast CT on 11/05/2019   FINDINGS: Lower Chest: No acute findings.   Hepatobiliary: No hepatic masses identified. A few tiny sub-cm hepatic cysts are noted. Gallbladder is unremarkable. No evidence of biliary ductal dilatation.   Pancreas:  No mass or inflammatory changes.   Spleen: Within normal limits in size and appearance.   Adrenals/Urinary Tract: No suspicious masses identified. No evidence of ureteral calculi or hydronephrosis.   Stomach/Bowel: No evidence of obstruction, inflammatory process or abnormal fluid collections.   Vascular/Lymphatic: No pathologically enlarged lymph nodes. No acute vascular findings.   Reproductive:  No mass or other significant abnormality.   Other:  None.   Musculoskeletal:  No suspicious bone lesions identified.   IMPRESSION: No acute findings or other significant abnormality within the abdomen or pelvis.     Electronically Signed   By: JMarlaine HindM.D.   On: 12/13/2021 16:21    ASSESSMENT/PLAN: 71year old female with a history of osteoporosis, kidney stones and arthritis who is seen in consult at the request of Dr. CLorelei Pontto evaluate change in bowel habit and blood with stool.   Blood in stool/change in bowel habits/fecal smearing --we discussed that symptoms could all relate to microbiome got disruption  after antibiotics in March 2023 combined with internal hemorrhoids.  That said she has never had colonoscopy recommended direct visualization to exclude other colorectal pathology.  We discussed the risk, benefits and alternatives and she is agreeable and wishes to proceed.  Reassuring abdominal CT scan recently as well as blood counts. -- Colonoscopy in the [place]-- Depending on findings would consider Metamucil to help fecal smearing as well as hemorrhoidal banding      CTD:[address]  [place] [id number]

## 2022-02-23 ENCOUNTER — Telehealth: Payer: Self-pay | Admitting: Internal Medicine

## 2022-02-23 ENCOUNTER — Encounter: Payer: Self-pay | Admitting: Internal Medicine

## 2022-02-23 MED ORDER — PLENVU 140 G PO SOLR: 1.0000 | ORAL | 0 refills | Status: DC

## 2022-02-23 NOTE — Telephone Encounter (Signed)
Patient called regarding the Plenvu for her upcoming colonoscopy.  She said the [facility] could not get it for her, so she is asking that you call [facility] into the [facility] on [address] in [place], [phone].  Thank you.

## 2022-02-23 NOTE — Telephone Encounter (Signed)
[facility] to [facility] in [facility] today   called patient to inform Plenvu sent

## 2022-02-25 NOTE — Telephone Encounter (Signed)
Spoke with patient to advise her that I have spoken to [facility] and Plenvu should arrive tomorrow for her to pick up. [facility]. Advised that if she has any additional issues getting her prep to make me aware.

## 2022-03-08 ENCOUNTER — Encounter: Payer: Self-pay | Admitting: Internal Medicine

## 2022-03-16 ENCOUNTER — Ambulatory Visit (AMBULATORY_SURGERY_CENTER): Payer: PPO | Admitting: Internal Medicine

## 2022-03-16 ENCOUNTER — Encounter: Payer: Self-pay | Admitting: Internal Medicine

## 2022-03-16 VITALS — BP 113/61 | HR 69 | Temp 97.7°F | Resp 12 | Ht 59.0 in | Wt 123.0 lb

## 2022-03-16 DIAGNOSIS — D123 Benign neoplasm of transverse colon: Secondary | ICD-10-CM | POA: Diagnosis not present

## 2022-03-16 DIAGNOSIS — D125 Benign neoplasm of sigmoid colon: Secondary | ICD-10-CM

## 2022-03-16 DIAGNOSIS — D124 Benign neoplasm of descending colon: Secondary | ICD-10-CM | POA: Diagnosis not present

## 2022-03-16 DIAGNOSIS — K625 Hemorrhage of anus and rectum: Secondary | ICD-10-CM | POA: Diagnosis not present

## 2022-03-16 DIAGNOSIS — D122 Benign neoplasm of ascending colon: Secondary | ICD-10-CM | POA: Diagnosis not present

## 2022-03-16 DIAGNOSIS — R194 Change in bowel habit: Secondary | ICD-10-CM | POA: Diagnosis not present

## 2022-03-16 MED ORDER — SODIUM CHLORIDE 0.9 % IV SOLN: 500.0000 mL | INTRAVENOUS | Status: AC

## 2022-03-16 NOTE — Progress Notes (Signed)
GASTROENTEROLOGY PROCEDURE H&P NOTE   Primary Care Physician: Darreld Mclean, MD    Reason for Procedure:  Change in bowel habits and rectal bleeding  Plan:    Colonoscopy  Patient is appropriate for endoscopic procedure(s) in the ambulatory ([place]) setting.  The nature of the procedure, as well as the risks, benefits, and alternatives were carefully and thoroughly reviewed with the patient. Ample time for discussion and questions allowed. The patient understood, was satisfied, and agreed to proceed.     HPI: Ruth Vaughn is a 72 y.o. female who presents for colonoscopy.  Medical history as below.  Tolerated the prep.  No recent chest pain or shortness of breath.  No abdominal pain today.  Past Medical History:  Diagnosis Date   Arthritis    Cataract    Fibromyalgia    Glaucoma    Kidney stones    Osteoporosis     Past Surgical History:  Procedure Laterality Date   CERVICAL SPINE SURGERY     CESAREAN SECTION     EYE SURGERY     SPINE SURGERY      Prior to Admission medications   Medication Sig Start Date End Date Taking? Authorizing Provider  Cholecalciferol (VITAMIN D PO) Take by mouth.   Yes [provider]  Glucosamine HCl (GLUCOSAMINE PO) Take by mouth.   Yes [provider]  PEG-KCl-NaCl-NaSulf-Na Asc-C (PLENVU) 140 g SOLR Take 1 kit by mouth as directed. Use coupon: BIN: [id number] PNC: CNRX Group: [id number] ID: [id number] 02/23/22  Yes Bee Marchiano, Lajuan Lines, MD  TURMERIC PO Take by mouth.   Yes [provider]  Multiple Vitamin (MULTI-VITAMIN DAILY PO) Take by mouth.    [provider]  nitrofurantoin, macrocrystal-monohydrate, (MACROBID) 100 MG capsule Take 1 capsule (100 mg total) by mouth 2 (two) times daily. 04/16/21   Copland, Gay Filler, MD  THEANINE PO Take by mouth as needed.    [provider]  traZODone (DESYREL) 50 MG tablet Take 0.5-1 tablets (25-50 mg total) by mouth at bedtime as needed for sleep. 11/17/21    Copland, Gay Filler, MD    Current Outpatient Medications  Medication Sig Dispense Refill   Cholecalciferol (VITAMIN D PO) Take by mouth.     Glucosamine HCl (GLUCOSAMINE PO) Take by mouth.     PEG-KCl-NaCl-NaSulf-Na Asc-C (PLENVU) 140 g SOLR Take 1 kit by mouth as directed. Use coupon: BIN: [id number] PNC: CNRX Group: [id number] ID: [id number] 1 each 0   TURMERIC PO Take by mouth.     Multiple Vitamin (MULTI-VITAMIN DAILY PO) Take by mouth.     nitrofurantoin, macrocrystal-monohydrate, (MACROBID) 100 MG capsule Take 1 capsule (100 mg total) by mouth 2 (two) times daily. 14 capsule 0   THEANINE PO Take by mouth as needed.     traZODone (DESYREL) 50 MG tablet Take 0.5-1 tablets (25-50 mg total) by mouth at bedtime as needed for sleep. 30 tablet 3   Current Facility-Administered Medications  Medication Dose Route Frequency Provider Last Rate Last Admin   0.9 %  sodium chloride infusion  500 mL Intravenous Continuous Brett Darko, Lajuan Lines, MD        Allergies as of 03/16/2022 - Review Complete 03/16/2022  Allergen Reaction Noted   Codeine Swelling 06/13/2011   Erythromycin  01/28/2013   Penicillins Rash 06/13/2011   Povidone iodine Dermatitis and Rash 08/03/1998   Sulfa antibiotics Swelling and Rash 06/13/2011    Family History  Problem Relation Age of Onset   Diabetes Father  Prostate cancer Father    Prostate cancer Sister    Prostate cancer Brother    Breast cancer Paternal Grandmother     Social History   Socioeconomic History   Marital status: Divorced    Spouse name: Not on file   Number of children: 2   Years of education: Not on file   Highest education level: Not on file  Occupational History   Not on file  Tobacco Use   Smoking status: Former   Smokeless tobacco: Never  Vaping Use   Vaping Use: Never used  Substance and Sexual Activity   Alcohol use: Yes    Comment: daily   Drug use: No   Sexual activity: Not on file  Other Topics Concern   Not on file  Social  History Narrative   Not on file   Social Determinants of Health   Financial Resource Strain: Low Risk  (02/18/2021)   Overall Financial Resource Strain (CARDIA)    Difficulty of Paying Living Expenses: Not hard at all  Food Insecurity: No Food Insecurity (02/18/2021)   Hunger Vital Sign    Worried About Running Out of Food in the Last Year: Never true    [place] in the Last Year: Never true  Transportation Needs: No Transportation Needs (02/18/2021)   PRAPARE - Hydrologist (Medical): No    Lack of Transportation (Non-Medical): No  Physical Activity: Insufficiently Active (02/18/2021)   Exercise Vital Sign    Days of Exercise per Week: 7 days    Minutes of Exercise per Session: 20 min  Stress: No Stress Concern Present (02/18/2021)   [place]    Feeling of Stress : Only a little  Social Connections: Socially Isolated (02/18/2021)   Social Connection and Isolation Panel [NHANES]    Frequency of Communication with Friends and Family: More than three times a week    Frequency of Social Gatherings with Friends and Family: More than three times a week    Attends Religious Services: Never    Marine scientist or Organizations: No    Attends Archivist Meetings: Never    Marital Status: Divorced  Human resources officer Violence: Not At Risk (02/18/2021)   Humiliation, Afraid, Rape, and Kick questionnaire    Fear of Current or Ex-Partner: No    Emotionally Abused: No    Physically Abused: No    Sexually Abused: No    Physical Exam: Vital signs in last 24 hours: '@BP'$  (!) 100/57   Pulse 75   Temp 97.7 F (36.5 C) (Temporal)   Resp 16   Ht '4\' 11"'$  (1.499 m)   Wt 123 lb (55.8 kg)   SpO2 99%   BMI 24.84 kg/m  GEN: NAD EYE: Sclerae anicteric ENT: MMM CV: Non-tachycardic Pulm: CTA b/l GI: Soft, NT/ND NEURO:  Alert & Oriented x 3   Zenovia Jarred, MD [facility]  03/16/2022 2:32 PM

## 2022-03-16 NOTE — Progress Notes (Signed)
Called to room to assist during endoscopic procedure.  Patient ID and intended procedure confirmed with present staff. Received instructions for my participation in the procedure from the performing physician.  

## 2022-03-16 NOTE — Op Note (Signed)
Major Patient Name: Ruth Vaughn Procedure Date: 03/16/2022 1:59 PM MRN: [id number] Endoscopist: Jerene Bears , MD, [id number] Age: 72 Referring MD:  Date of Birth: 24-Jun-1950 Gender: Female Account #: [id number] Procedure:                Colonoscopy Indications:              Rectal bleeding, Incidental change in bowel habits                            noted Medicines:                Monitored Anesthesia Care Procedure:                Pre-Anesthesia Assessment:                           - Prior to the procedure, a History and Physical                            was performed, and patient medications and                            allergies were reviewed. The patient's tolerance of                            previous anesthesia was also reviewed. The risks                            and benefits of the procedure and the sedation                            options and risks were discussed with the patient.                            All questions were answered, and informed consent                            was obtained. Prior Anticoagulants: The patient has                            taken no anticoagulant or antiplatelet agents. ASA                            Grade Assessment: II - A patient with mild systemic                            disease. After reviewing the risks and benefits,                            the patient was deemed in satisfactory condition to                            undergo the procedure.  After obtaining informed consent, the colonoscope                            was passed under direct vision. Throughout the                            procedure, the patient's blood pressure, pulse, and                            oxygen saturations were monitored continuously. The                            Olympus PCF-H190DL ([id number]) Colonoscope was                            introduced through the anus and advanced to the                             cecum. The colonoscopy was performed without                            difficulty. The patient tolerated the procedure                            well. The quality of the bowel preparation was                            good. The ileocecal valve, appendiceal orifice, and                            rectum were photographed. Scope In: 2:05:56 PM Scope Out: 2:26:17 PM Scope Withdrawal Time: 0 hours 16 minutes 22 seconds  Total Procedure Duration: 0 hours 20 minutes 21 seconds  Findings:                 The digital rectal exam was normal.                           An 8 mm polyp was found in the ascending colon. The                            polyp was sessile. The polyp was removed with a                            cold snare. Resection and retrieval were complete.                           A 7 mm polyp was found in the hepatic flexure. The                            polyp was sessile. The polyp was removed with a                            cold snare.  Resection and retrieval were complete.                           A 5 mm polyp was found in the descending colon. The                            polyp was sessile. The polyp was removed with a                            cold snare. Resection and retrieval were complete.                           A 3 mm polyp was found in the sigmoid colon. The                            polyp was sessile. The polyp was removed with a                            cold snare. Resection and retrieval were complete.                           Multiple small-mouthed diverticula were found in                            the hepatic flexure.                           Internal hemorrhoids were found during                            retroflexion. The hemorrhoids were small. Complications:            No immediate complications. Estimated Blood Loss:     Estimated blood loss was minimal. Impression:               - One 8 mm polyp in the ascending  colon, removed                            with a cold snare. Resected and retrieved.                           - One 7 mm polyp at the hepatic flexure, removed                            with a cold snare. Resected and retrieved.                           - One 5 mm polyp in the descending colon, removed                            with a cold snare. Resected and retrieved.                           - One 3  mm polyp in the sigmoid colon, removed with                            a cold snare. Resected and retrieved.                           - Mild diverticulosis at the hepatic flexure.                           - Small internal hemorrhoids. Recommendation:           - Patient has a contact number available for                            emergencies. The signs and symptoms of potential                            delayed complications were discussed with the                            patient. Return to normal activities tomorrow.                            Written discharge instructions were provided to the                            patient.                           - Resume previous diet.                           - Continue present medications.                           - Await pathology results.                           - Repeat colonoscopy is recommended for                            surveillance. The colonoscopy date will be                            determined after pathology results from today's                            exam become available for review. Jerene Bears, MD 03/16/2022 2:29:58 PM This report has been signed electronically.

## 2022-03-16 NOTE — Patient Instructions (Signed)
Handout on polyps and diverticulosis given.  Resume previous diet, continue present medications.   YOU HAD AN ENDOSCOPIC PROCEDURE TODAY AT [facility]:   Refer to the procedure report that was given to you for any specific questions about what was found during the examination.  If the procedure report does not answer your questions, please call your gastroenterologist to clarify.  If you requested that your care partner not be given the details of your procedure findings, then the procedure report has been included in a sealed envelope for you to review at your convenience later.  YOU SHOULD EXPECT: Some feelings of bloating in the abdomen. Passage of more gas than usual.  Walking can help get rid of the air that was put into your GI tract during the procedure and reduce the bloating. If you had a lower endoscopy (such as a colonoscopy or flexible sigmoidoscopy) you may notice spotting of blood in your stool or on the toilet paper. If you underwent a bowel prep for your procedure, you may not have a normal bowel movement for a few days.  Please Note:  You might notice some irritation and congestion in your nose or some drainage.  This is from the oxygen used during your procedure.  There is no need for concern and it should clear up in a day or so.  SYMPTOMS TO REPORT IMMEDIATELY:  Following lower endoscopy (colonoscopy or flexible sigmoidoscopy):  Excessive amounts of blood in the stool  Significant tenderness or worsening of abdominal pains  Swelling of the abdomen that is new, acute  Fever of 100F or higher   For urgent or emergent issues, a gastroenterologist can be reached at any hour by calling [phone]. Do not use MyChart messaging for urgent concerns.    DIET:  We do recommend a small meal at first, but then you may proceed to your regular diet.  Drink plenty of fluids but you should avoid alcoholic beverages for 24 hours.  ACTIVITY:  You should plan to take  it easy for the rest of today and you should NOT DRIVE or use heavy machinery until tomorrow (because of the sedation medicines used during the test).    FOLLOW UP: Our staff will call the number listed on your records the next business day following your procedure.  We will call around 7:15- 8:00 am to check on you and address any questions or concerns that you may have regarding the information given to you following your procedure. If we do not reach you, we will leave a message.     If any biopsies were taken you will be contacted by phone or by letter within the next 1-3 weeks.  Please call us at [phone] if you have not heard about the biopsies in 3 weeks.    SIGNATURES/CONFIDENTIALITY: You and/or your care partner have signed paperwork which will be entered into your electronic medical record.  These signatures attest to the fact that that the information above on your After Visit Summary has been reviewed and is understood.  Full responsibility of the confidentiality of this discharge information lies with you and/or your care-partner.

## 2022-03-16 NOTE — Progress Notes (Signed)
Report to PACU, RN, vss, BBS= Clear.  

## 2022-03-16 NOTE — Progress Notes (Signed)
Pt's states no medical or surgical changes since previsit or office visit. 

## 2022-03-17 ENCOUNTER — Telehealth: Payer: Self-pay

## 2022-03-17 NOTE — Telephone Encounter (Signed)
  Follow up Call-     03/16/2022    1:07 PM  Call back number  Post procedure Call Back phone  # [phone]  Permission to leave phone message Yes     Patient questions:  Do you have a fever, pain , or abdominal swelling? No. Pain Score  0 *  Have you tolerated food without any problems? Yes.    Have you been able to return to your normal activities? Yes.    Do you have any questions about your discharge instructions: Diet   No. Medications  No. Follow up visit  No.  Do you have questions or concerns about your Care? No.  Actions: * If pain score is 4 or above: No action needed, pain <4.

## 2022-03-24 ENCOUNTER — Encounter: Payer: Self-pay | Admitting: Internal Medicine

## 2022-08-04 DIAGNOSIS — H04123 Dry eye syndrome of bilateral lacrimal glands: Secondary | ICD-10-CM | POA: Diagnosis not present

## 2022-08-04 DIAGNOSIS — H524 Presbyopia: Secondary | ICD-10-CM | POA: Diagnosis not present

## 2022-08-04 DIAGNOSIS — H02831 Dermatochalasis of right upper eyelid: Secondary | ICD-10-CM | POA: Diagnosis not present

## 2022-08-04 DIAGNOSIS — H401131 Primary open-angle glaucoma, bilateral, mild stage: Secondary | ICD-10-CM | POA: Diagnosis not present

## 2022-08-04 DIAGNOSIS — H26491 Other secondary cataract, right eye: Secondary | ICD-10-CM | POA: Diagnosis not present

## 2023-02-03 DIAGNOSIS — L821 Other seborrheic keratosis: Secondary | ICD-10-CM | POA: Diagnosis not present

## 2023-02-03 DIAGNOSIS — B351 Tinea unguium: Secondary | ICD-10-CM | POA: Diagnosis not present

## 2023-02-06 DIAGNOSIS — H401131 Primary open-angle glaucoma, bilateral, mild stage: Secondary | ICD-10-CM | POA: Diagnosis not present

## 2023-03-14 NOTE — Patient Instructions (Incomplete)
It was good to see you again today, I will be in touch with your lab results  Recommend COVID booster if not in the last 6 months or so Also recommend shingles vaccine series if not done yet-Shingrix Also recommend flu, pneumonia vaccination  Referral to allergist for allergy testing- avoid shellfish for now!   Keep epipen and benadryl handy IF you start to have an allergic reaction take 2 benadryl right away- epipen should be used (and go to ER/ call 911) if you have lip, tongue, mouth swelling or difficulty breathing  For likely GERD symptoms- pantoprazole 40 mg once daily for 2-4 weeks.  Let me know how this works for you If any change or worsening of these symptoms please alert me!    Go to imaging to set up mammo and CT coronary on the way out!

## 2023-03-14 NOTE — Progress Notes (Unsigned)
[facility] [address], [address] [place], [place] [address] [phone]  Date:  03/16/2023   Name:  Ruth Vaughn   DOB:  05-10-1950   MRN:  [id number]  PCP:  Pearline Cables, MD    Chief Complaint: No chief complaint on file.   History of Present Illness:  Ruth Vaughn is a 73 y.o. very pleasant female patient who presents with the following:  Patient seen today for physical exam- History of osteoporosis, B12 and vitamin D deficiency, glaucoma  Most recent visit with myself was in September 2023.  At that time she had concern about change in her bowel habits.  We referred her to gastroenterology, she had a colonoscopy January 2024 with removal of 4 polyps-all benign She was asked to follow-up in 3 years  Labs-September 2023, update today Flu vaccine Pneumonia vaccine Shingrix COVID booster Mammogram 10/23-can update Bone density 10/23-osteoporosis I offered her treatment for osteoporosis in 2023 but she did not take me up on it.  Can ask her about this again   Patient Active Problem List   Diagnosis Date Noted   Microscopic hematuria 10/14/2019   Osteopenia 04/17/2019   Right shoulder pain 07/11/2017   Vitamin D deficiency 01/10/2017   Vitamin B12 deficiency 07/25/2016   Depression, recurrent (HCC) 07/25/2016    Past Medical History:  Diagnosis Date   Arthritis    Cataract    Fibromyalgia    Glaucoma    Kidney stones    Osteoporosis     Past Surgical History:  Procedure Laterality Date   CERVICAL SPINE SURGERY     CESAREAN SECTION     EYE SURGERY     SPINE SURGERY      Social History   Tobacco Use   Smoking status: Former   Smokeless tobacco: Never  Vaping Use   Vaping status: Never Used  Substance Use Topics   Alcohol use: Yes    Comment: daily   Drug use: No    Family History  Problem Relation Age of Onset   Diabetes Father    Prostate cancer Father    Prostate cancer  Sister    Prostate cancer Brother    Breast cancer Paternal Grandmother     Allergies  Allergen Reactions   Codeine Swelling   Erythromycin    Penicillins Rash   Povidone Iodine Dermatitis and Rash   Sulfa Antibiotics Swelling and Rash    Medication list has been reviewed and updated.  Current Outpatient Medications on File Prior to Visit  Medication Sig Dispense Refill   Cholecalciferol (VITAMIN D PO) Take by mouth.     Glucosamine HCl (GLUCOSAMINE PO) Take by mouth.     Multiple Vitamin (MULTI-VITAMIN DAILY PO) Take by mouth.     nitrofurantoin, macrocrystal-monohydrate, (MACROBID) 100 MG capsule Take 1 capsule (100 mg total) by mouth 2 (two) times daily. 14 capsule 0   PEG-KCl-NaCl-NaSulf-Na Asc-C (PLENVU) 140 g SOLR Take 1 kit by mouth as directed. Use coupon: BIN: [id number] PNC: CNRX Group: [id number] ID: [id number] 1 each 0   THEANINE PO Take by mouth as needed.     traZODone (DESYREL) 50 MG tablet Take 0.5-1 tablets (25-50 mg total) by mouth at bedtime as needed for sleep. 30 tablet 3   TURMERIC PO Take by mouth.     Current Facility-Administered Medications on File Prior to Visit  Medication Dose Route Frequency Provider Last Rate Last Admin  0.9 %  sodium chloride infusion  500 mL Intravenous Continuous Pyrtle, Carie Caddy, MD        Review of Systems:  As per HPI- otherwise negative.   Physical Examination: There were no vitals filed for this visit. There were no vitals filed for this visit. There is no height or weight on file to calculate BMI. Ideal Body Weight:    GEN: no acute distress. HEENT: Atraumatic, Normocephalic.  Ears and Nose: No external deformity. CV: RRR, No M/G/R. No JVD. No thrill. No extra heart sounds. PULM: CTA B, no wheezes, crackles, rhonchi. No retractions. No resp. distress. No accessory muscle use. ABD: S, NT, ND, +BS. No rebound. No HSM. EXTR: No c/c/e PSYCH: Normally interactive. Conversant.    Assessment and  Plan: ***  Signed Abbe Amsterdam, MD

## 2023-03-16 ENCOUNTER — Encounter: Payer: Self-pay | Admitting: Family Medicine

## 2023-03-16 ENCOUNTER — Ambulatory Visit (INDEPENDENT_AMBULATORY_CARE_PROVIDER_SITE_OTHER): Payer: PPO | Admitting: Family Medicine

## 2023-03-16 VITALS — BP 120/72 | HR 63 | Temp 97.8°F | Resp 18 | Ht <= 58 in | Wt 117.0 lb

## 2023-03-16 DIAGNOSIS — Z0000 Encounter for general adult medical examination without abnormal findings: Secondary | ICD-10-CM

## 2023-03-16 DIAGNOSIS — Z131 Encounter for screening for diabetes mellitus: Secondary | ICD-10-CM | POA: Diagnosis not present

## 2023-03-16 DIAGNOSIS — E559 Vitamin D deficiency, unspecified: Secondary | ICD-10-CM | POA: Diagnosis not present

## 2023-03-16 DIAGNOSIS — Z13220 Encounter for screening for lipoid disorders: Secondary | ICD-10-CM

## 2023-03-16 DIAGNOSIS — E538 Deficiency of other specified B group vitamins: Secondary | ICD-10-CM

## 2023-03-16 DIAGNOSIS — Z130 Encounter for screening for diseases of the blood and blood-forming organs and certain disorders involving the immune mechanism: Secondary | ICD-10-CM

## 2023-03-16 DIAGNOSIS — Z91013 Allergy to seafood: Secondary | ICD-10-CM | POA: Diagnosis not present

## 2023-03-16 DIAGNOSIS — Z1231 Encounter for screening mammogram for malignant neoplasm of breast: Secondary | ICD-10-CM

## 2023-03-16 DIAGNOSIS — Z1329 Encounter for screening for other suspected endocrine disorder: Secondary | ICD-10-CM

## 2023-03-16 DIAGNOSIS — R079 Chest pain, unspecified: Secondary | ICD-10-CM | POA: Diagnosis not present

## 2023-03-16 LAB — LIPID PANEL
Cholesterol: 246 mg/dL — ABNORMAL HIGH (ref 0–200)
HDL: 87.5 mg/dL (ref >39.00)
LDL Cholesterol: 142 mg/dL — ABNORMAL HIGH (ref 0–99)
NonHDL: 158.2
Total CHOL/HDL Ratio: 3
Triglycerides: 79 mg/dL (ref 0.0–149.0)
VLDL: 15.8 mg/dL (ref 0.0–40.0)

## 2023-03-16 LAB — COMPREHENSIVE METABOLIC PANEL
ALT: 13 U/L (ref 0–35)
AST: 20 U/L (ref 0–37)
Albumin: 4.5 g/dL (ref 3.5–5.2)
Alkaline Phosphatase: 48 U/L (ref 39–117)
BUN: 12 mg/dL (ref 6–23)
CO2: 29 meq/L (ref 19–32)
Calcium: 9.6 mg/dL (ref 8.4–10.5)
Chloride: 104 meq/L (ref 96–112)
Creatinine, Ser: 0.68 mg/dL (ref 0.40–1.20)
GFR: 87.11 mL/min (ref >60.00)
Glucose, Bld: 93 mg/dL (ref 70–99)
Potassium: 4.4 meq/L (ref 3.5–5.1)
Sodium: 141 meq/L (ref 135–145)
Total Bilirubin: 0.9 mg/dL (ref 0.2–1.2)
Total Protein: 7.1 g/dL (ref 6.0–8.3)

## 2023-03-16 LAB — CBC
HCT: 45.5 % (ref 36.0–46.0)
Hemoglobin: 15 g/dL (ref 12.0–15.0)
MCHC: 32.9 g/dL (ref 30.0–36.0)
MCV: 94.9 fL (ref 78.0–100.0)
Platelets: 260 10*3/uL (ref 150.0–400.0)
RBC: 4.8 Mil/uL (ref 3.87–5.11)
RDW: 13.6 % (ref 11.5–15.5)
WBC: 5.2 10*3/uL (ref 4.0–10.5)

## 2023-03-16 LAB — TSH: TSH: 1.19 u[IU]/mL (ref 0.35–5.50)

## 2023-03-16 LAB — VITAMIN B12: Vitamin B-12: 513 pg/mL (ref 211–911)

## 2023-03-16 LAB — VITAMIN D 25 HYDROXY (VIT D DEFICIENCY, FRACTURES): VITD: 38.92 ng/mL (ref 30.00–100.00)

## 2023-03-16 LAB — HEMOGLOBIN A1C: Hgb A1c MFr Bld: 5.5 % (ref 4.6–6.5)

## 2023-03-16 MED ORDER — PANTOPRAZOLE SODIUM 40 MG PO TBEC: 40.0000 mg | DELAYED_RELEASE_TABLET | Freq: Every day | ORAL | 0 refills | Status: DC

## 2023-03-16 MED ORDER — EPINEPHRINE 0.3 MG/0.3ML IJ SOAJ: 0.3000 mg | INTRAMUSCULAR | 0 refills | Status: AC | PRN

## 2023-03-23 ENCOUNTER — Ambulatory Visit (HOSPITAL_BASED_OUTPATIENT_CLINIC_OR_DEPARTMENT_OTHER)
Admission: RE | Admit: 2023-03-23 | Discharge: 2023-03-23 | Disposition: A | Discharge status: Home / Self Care | Payer: Self-pay | Source: Ambulatory Visit | Attending: Family Medicine | Admitting: Family Medicine

## 2023-03-23 ENCOUNTER — Encounter (HOSPITAL_BASED_OUTPATIENT_CLINIC_OR_DEPARTMENT_OTHER): Payer: Self-pay

## 2023-03-23 ENCOUNTER — Ambulatory Visit (HOSPITAL_BASED_OUTPATIENT_CLINIC_OR_DEPARTMENT_OTHER)
Admission: RE | Admit: 2023-03-23 | Discharge: 2023-03-23 | Disposition: A | Discharge status: Home / Self Care | Payer: PPO | Source: Ambulatory Visit | Attending: Family Medicine | Admitting: Family Medicine

## 2023-03-23 DIAGNOSIS — R079 Chest pain, unspecified: Secondary | ICD-10-CM | POA: Insufficient documentation

## 2023-03-23 DIAGNOSIS — Z1231 Encounter for screening mammogram for malignant neoplasm of breast: Secondary | ICD-10-CM | POA: Insufficient documentation

## 2023-03-24 ENCOUNTER — Encounter: Payer: Self-pay | Admitting: Family Medicine

## 2023-03-24 DIAGNOSIS — R079 Chest pain, unspecified: Secondary | ICD-10-CM

## 2023-04-11 ENCOUNTER — Encounter: Payer: Self-pay | Admitting: Family Medicine

## 2023-04-11 DIAGNOSIS — R079 Chest pain, unspecified: Secondary | ICD-10-CM

## 2023-04-11 MED ORDER — PANTOPRAZOLE SODIUM 40 MG PO TBEC: 40.0000 mg | DELAYED_RELEASE_TABLET | Freq: Every day | ORAL | 1 refills | Status: DC

## 2023-04-12 ENCOUNTER — Encounter: Payer: Self-pay | Admitting: Family Medicine

## 2023-04-12 DIAGNOSIS — R079 Chest pain, unspecified: Secondary | ICD-10-CM

## 2023-04-13 ENCOUNTER — Encounter: Payer: Self-pay | Admitting: Family Medicine

## 2023-04-13 ENCOUNTER — Other Ambulatory Visit (HOSPITAL_BASED_OUTPATIENT_CLINIC_OR_DEPARTMENT_OTHER): Payer: Self-pay

## 2023-04-13 DIAGNOSIS — R079 Chest pain, unspecified: Secondary | ICD-10-CM

## 2023-04-13 MED ORDER — PANTOPRAZOLE SODIUM 40 MG PO TBEC
40.0000 mg | DELAYED_RELEASE_TABLET | Freq: Every day | ORAL | 1 refills | Status: DC
  Filled 2023-04-13: qty 90, 90d supply, fill #0
  Filled 2023-06-19: qty 90, 90d supply, fill #1

## 2023-04-13 NOTE — Addendum Note (Signed)
 Addended by: Abbe Amsterdam C on: 04/13/2023 12:54 PM   Modules accepted: Orders

## 2023-04-14 ENCOUNTER — Telehealth (HOSPITAL_COMMUNITY): Payer: Self-pay | Admitting: Family Medicine

## 2023-04-14 NOTE — Telephone Encounter (Signed)
 I called to schedule patient the ordered Myoview and patient does not feel comfortable scheduling yet and wants to hold off til after she sees the GI Doctor. She will then determine if she wants to have. Order will be removed from the [facility]. IF patient decides to have we will reinstate the order. Thank you.

## 2023-04-18 ENCOUNTER — Telehealth (HOSPITAL_BASED_OUTPATIENT_CLINIC_OR_DEPARTMENT_OTHER): Payer: Self-pay

## 2023-04-19 ENCOUNTER — Encounter: Payer: Self-pay | Admitting: Internal Medicine

## 2023-04-19 ENCOUNTER — Ambulatory Visit: Payer: PPO | Admitting: Internal Medicine

## 2023-04-19 VITALS — BP 104/68 | HR 80 | Temp 97.7°F | Resp 17 | Ht 59.0 in | Wt 113.4 lb

## 2023-04-19 DIAGNOSIS — L500 Allergic urticaria: Secondary | ICD-10-CM | POA: Diagnosis not present

## 2023-04-19 DIAGNOSIS — Z881 Allergy status to other antibiotic agents status: Secondary | ICD-10-CM | POA: Insufficient documentation

## 2023-04-19 DIAGNOSIS — T63481D Toxic effect of venom of other arthropod, accidental (unintentional), subsequent encounter: Secondary | ICD-10-CM | POA: Diagnosis not present

## 2023-04-19 DIAGNOSIS — J3089 Other allergic rhinitis: Secondary | ICD-10-CM | POA: Insufficient documentation

## 2023-04-19 DIAGNOSIS — T63481A Toxic effect of venom of other arthropod, accidental (unintentional), initial encounter: Secondary | ICD-10-CM | POA: Insufficient documentation

## 2023-04-19 DIAGNOSIS — K219 Gastro-esophageal reflux disease without esophagitis: Secondary | ICD-10-CM | POA: Insufficient documentation

## 2023-04-19 NOTE — Progress Notes (Signed)
 NEW PATIENT Date of Service/Encounter:  04/19/23 Referring provider: Pearline Cables, MD Primary care provider: Pearline Cables, MD  Subjective:  Ruth Vaughn is a 73 y.o. female  presenting today for evaluation of food allergy  History obtained from: chart review and patient.   Discussed the use of AI scribe software for clinical note transcription with the patient, who gave verbal consent to proceed.  The patient experienced hives approximately one hour after consuming shrimp and scallops on their birthday. The hives initially appeared around their waist, resembling mosquito bites, and then spread to their face, trunk, and legs. The hives persisted for a couple of weeks. They took Benadryl for the itching a day or two after the initial reaction.  They had consumed shellfish numerous times before without issues but have not eaten shellfish since the incident. They have consumed fin fish and dairy products without problems. They have no prior history of food allergies.  They experienced a significant event about a week to ten days prior to the shellfish reaction where they were stung by yellow jackets while gardening, receiving ten to fifteen stings. They speculate that this could have contributed to their hives.  She has an epipen for this reaction.  No written   They have a history of seasonal allergies, which began after Ulyess Mort five to six years ago, characterized by stuffiness and rhinorrhea, primarily in the spring. They use Flonase and Claritin to manage these symptoms.  GERD following with GI.  Currently waiting for ultrasound of her gallbladder for ongoing GI symptoms.  They have a history of drug allergies, including reactions to penicillin, erythromycin, codeine, sulfa drugs, and Betadine. They also report stress hives in the past when under significant stress.  Other allergy screening: Asthma: no Rhino conjunctivitis: yes Food allergy:  maybe as above   Medication allergy: yes Hymenoptera allergy:  toxic reaction  Urticaria: yes Eczema:no History of recurrent infections suggestive of immunodeficency: no Vaccinations are up to date.   Past Medical History: Past Medical History:  Diagnosis Date   Arthritis    Cataract    Fibromyalgia    Glaucoma    Kidney stones    Osteoporosis    Medication List:  Current Outpatient Medications  Medication Sig Dispense Refill   Calcium Carb-Cholecalciferol (TGT CALCIUM DIETARY SUPPLEMENT PO)      Cholecalciferol (VITAMIN D PO) Take by mouth.     EPINEPHrine (EPIPEN 2-PAK) 0.3 mg/0.3 mL IJ SOAJ injection Inject 0.3 mg into the muscle as needed for anaphylaxis. 2 each 0   Glucosamine HCl (GLUCOSAMINE PO) Take by mouth.     Multiple Vitamin (MULTI-VITAMIN DAILY PO) Take by mouth.     pantoprazole (PROTONIX) 40 MG tablet Take 1 tablet (40 mg total) by mouth daily. 90 tablet 1   PEG-KCl-NaCl-NaSulf-Na Asc-C (PLENVU) 140 g SOLR Take 1 kit by mouth as directed. Use coupon: BIN: [id number] PNC: CNRX Group: [id number] ID: [id number] 1 each 0   THEANINE PO Take by mouth as needed.     Current Facility-Administered Medications  Medication Dose Route Frequency Provider Last Rate Last Admin   0.9 %  sodium chloride infusion  500 mL Intravenous Continuous Pyrtle, Carie Caddy, MD       Known Allergies:  Allergies  Allergen Reactions   Codeine Swelling   Erythromycin    Penicillins Rash   Povidone Iodine Dermatitis and Rash   Sulfa Antibiotics Swelling and Rash   Past Surgical History: Past Surgical History:  Procedure Laterality  Date   CERVICAL SPINE SURGERY     CESAREAN SECTION     EYE SURGERY     SPINE SURGERY     Family History: Family History  Problem Relation Age of Onset   Diabetes Father    Prostate cancer Father    Prostate cancer Sister    Prostate cancer Brother    Breast cancer Paternal Grandmother    Social History: Ruth Vaughn lives  [facility] that is 73 years old, no water damage with  hardwoods throughout. Gas heating, central cooling, 1 dog with access to bedroom. No roaches and bed is 2 foot off the groud, retired, no tobacco exposure   ROS:  All other systems negative except as noted per HPI.  Objective:  Blood pressure 104/68, pulse 80, temperature 97.7 F (36.5 C), temperature source Temporal, resp. rate 17, height 4\' 11"  (1.499 m), weight 113 lb 6.4 oz (51.4 kg), SpO2 100%. Body mass index is 22.9 kg/m. Physical Exam:  General Appearance:  Alert, cooperative, no distress, appears stated age  Head:  Normocephalic, without obvious abnormality, atraumatic  Eyes:  Conjunctiva clear, EOM's intact  Ears EACs normal bilaterally  Nose: Nares normal, normal mucosa, no visible anterior polyps, and septum midline  Throat: Lips, tongue normal; teeth and gums normal, normal posterior oropharynx  Neck: Supple, symmetrical  Lungs:   clear to auscultation bilaterally, Respirations unlabored, no coughing  Heart:  regular rate and rhythm and no murmur, Appears well perfused  Extremities: No edema  Skin: Skin color, texture, turgor normal and no rashes or lesions on visualized portions of skin  Neurologic: No gross deficits   Diagnostics: None    Labs:  Lab Orders         Chronic Urticaria         IgE         Tryptase         Allergen Profile, Shellfish       Assessment and Plan  Assessment and Plan   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 - If using Epinephrine autoinjector, call 911 - A food allergy action plan has been provided and discussed. - Medic  Alert identification is recommended.   Chronic Rhinitis  :  - Prevention:  - Symptom control: - Continue Nasal Steroid Spray: Best results if used daily. - Options include Flonase (fluticasone), Nasocort (triamcinolone), Nasonex (mometasome) 1- 2 sprays in each nostril daily.  - All can be purchased over-the-counter if not covered by insurance. - Continue Antihistamine: daily or daily as needed.   -Options include Zyrtec (Cetirizine) 10mg , Claritin (Loratadine) 10mg , Allegra (Fexofenadine) 180mg , or Xyzal (Levocetirinze) 5mg  - Can be purchased over-the-counter if not covered by insurance.   Follow up: for allergy testing (1-55, shellfish and mollusks)             This note in its entirety was forwarded to the Provider who requested this consultation.  Other:     Thank you for your kind referral. I appreciate the opportunity to take part in Ruth Vaughn's care. Please do not hesitate to contact me with questions.  Sincerely,  Thank you so much for letting me partake in your care today.  Don't hesitate to reach out if you have any additional concerns!  Ferol Luz, MD  [facility] [place], [place]

## 2023-04-19 NOTE — Patient Instructions (Addendum)
 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 - If using Epinephrine autoinjector, call 911 - A food allergy action plan has been provided and discussed.  She has a history of - Medic Alert identification is recommended.   Chronic Rhinitis    :  - Prevention:  - Symptom control: - Continue Nasal Steroid Spray: Best results if used daily. - Options include Flonase (fluticasone), Nasocort (triamcinolone), Nasonex (mometasome) 1- 2 sprays in each nostril daily.  - All can be purchased over-the-counter if not covered by insurance. - Continue Antihistamine: daily or daily as needed.   -Options include Zyrtec (Cetirizine) 10mg , Claritin (Loratadine) 10mg , Allegra (Fexofenadine) 180mg , or Xyzal (Levocetirinze) 5mg  - Can be purchased over-the-counter if not covered by insurance.   Follow up: for allergy testing (1-55, shellfish and mollusks)   Thank you so much for letting me partake in your care today.  Don't hesitate to reach out if you have any additional concerns!  Ferol Luz, MD  [facility] [place], [place]

## 2023-04-20 ENCOUNTER — Encounter: Payer: Self-pay | Admitting: Internal Medicine

## 2023-04-25 ENCOUNTER — Ambulatory Visit (HOSPITAL_BASED_OUTPATIENT_CLINIC_OR_DEPARTMENT_OTHER)
Admission: RE | Admit: 2023-04-25 | Discharge: 2023-04-25 | Disposition: A | Discharge status: Home / Self Care | Payer: PPO | Source: Ambulatory Visit | Attending: Family Medicine | Admitting: Family Medicine

## 2023-04-25 ENCOUNTER — Encounter: Payer: Self-pay | Admitting: Family Medicine

## 2023-04-25 DIAGNOSIS — R079 Chest pain, unspecified: Secondary | ICD-10-CM | POA: Diagnosis not present

## 2023-04-25 DIAGNOSIS — Z0389 Encounter for observation for other suspected diseases and conditions ruled out: Secondary | ICD-10-CM | POA: Diagnosis not present

## 2023-04-26 ENCOUNTER — Ambulatory Visit (INDEPENDENT_AMBULATORY_CARE_PROVIDER_SITE_OTHER): Payer: PPO | Admitting: Internal Medicine

## 2023-04-26 DIAGNOSIS — L500 Allergic urticaria: Secondary | ICD-10-CM

## 2023-04-26 NOTE — Patient Instructions (Addendum)
 Marland Kitchen

## 2023-04-26 NOTE — Progress Notes (Signed)
  Date of Service/Encounter:  04/26/23  Allergy testing appointment   Initial visit on 04/19/23, seen for urticaria .  Please see that note for additional details.  Today reports for allergy diagnostic testing:    DIAGNOSTICS:  Skin Testing: Select foods. Adequate positive and negative controls Results discussed with patient/family.  Food Adult Perc - 04/26/23 1200     Time Antigen Placed 1200    Allergen Manufacturer Waynette Buttery    Location Back    Number of allergen test 7     Control-buffer 50% Glycerol Negative    Control-Histamine 3+    23. Shrimp Negative    24. Crab Negative    25. Lobster Negative    26. Oyster Negative    27. Scallops Negative             Allergy testing results were read and interpreted by myself, documented by clinical staff.  Patient provided with copy of allergy testing along with avoidance measures when indicated.   Ferol Luz, MD  [facility] [place]

## 2023-04-28 LAB — ALLERGEN PROFILE, SHELLFISH
Clam IgE: <0.1 kU/L
F023-IgE Crab: <0.1 kU/L
F080-IgE Lobster: <0.1 kU/L
F290-IgE Oyster: <0.1 kU/L
Scallop IgE: <0.1 kU/L
Shrimp IgE: <0.1 kU/L

## 2023-04-28 LAB — CHRONIC URTICARIA: cu index: <1.4 (ref <10)

## 2023-04-28 LAB — IGE: IgE (Immunoglobulin E), Serum: 188 [IU]/mL (ref 6–495)

## 2023-04-28 LAB — TRYPTASE: Tryptase: 6.9 ug/L (ref 2.2–13.2)

## 2023-05-03 NOTE — Progress Notes (Signed)
 Chronic hive lasb were all normal/negative. Shellfish labs and skin test negative.  Lab results already given to patient.

## 2023-05-15 NOTE — Progress Notes (Unsigned)
 05/16/2023 Ruth Vaughn [id number] [birth year]  Referring provider: Pearline Cables, MD Primary GI doctor: Dr. Rhea Belton  ASSESSMENT AND PLAN:   GERD with epigastric pressure radiating around to her back, intermittent dysphagia for 3-4 months, rare nausea but no vomiting Worse post prandial, worse ice cream, chocolate, cheese, nocturnal symptoms at night, has woken her up, tums helps 04/25/2023 RUQ no gallstones normal bile ducts unremarkable liver 02/2023 CBC without anemia or leukocytosis, liver function unremarkable Rare NSAIDS, has glass of wine or one beer daily years, remote smoking history Has been protonix 40 mg since Jan with improvement of her symptoms about 60 % No melena, no weight loss Likely this is GERD related secondary to ETOH but patient did have CAD on LAD, has family history, has elevated LDL, will delay EGD until after stress test - diatherix H pylori - Barium swallow - continue life style, stop ETOH/tumeric - continue protonix 40 mg once daily - follow up 2 months  CAD with chest discomfort, can be after food at night, with associated nausea Mother with heart disease history, remote smoking history , LDL at 142 not on statin Walks dog, did push mower without chest pain Coronary calcium score 90.5 in LAD, 66 percentile Being referred to cardiology for stress test  Rectal bleeding/change in bowel habits 12/10/2021 CT abdomen pelvis for diarrhea unremarkable 03/12/2022 colonoscopy good bowel prep 8 mm polyp 7 mm polyp 5 mm polyp 3 mm polyp diverticulosis hepatic flexure small internal hemorrhoids Tryptase and IgE negative  Personal history of tubular adenomatous polyps 3 TA polyps colonoscopy 03/12/2022 recall 3 years Recall 02/2025  Patient Care Team: Copland, Gwenlyn Found, MD as PCP - General (Family Medicine) Mateo Flow, MD as Consulting Physician (Ophthalmology)  HISTORY OF PRESENT ILLNESS: 73 y.o. female with a past medical history listed  below, known to Dr. Rhea Belton presents with GERD.  Discussed the use of AI scribe software for clinical note transcription with the patient, who gave verbal consent to proceed.  History of Present Illness   Ruth Vaughn is a 73 year old female who presents with upper gastrointestinal symptoms.  She has been experiencing upper gastrointestinal symptoms, primarily heartburn, for the past three to four months. The sensation is described as pressure that can radiate around her back and is often accompanied by frequent burping. Occasionally, she experiences difficulty swallowing, which she attributes to eating quickly. Symptoms are more frequent at night and have woken her up, but are alleviated by taking Tums.  She has been keeping a food diary for over a month and notes that symptoms seem to be related to certain foods, such as chocolate, ice cream, and sometimes cheese. She rates her pain as usually a 3 out of 10, occasionally reaching a 5. She has been on pantoprazole 40 mg once daily for over two months, which has improved her symptoms by about 60%.  She occasionally experiences nausea, particularly when the pain is intense, but denies vomiting. No shortness of breath, fast heartbeats, or symptoms with exertion.  She underwent a CT scan in 2023 for diarrhea, which was unremarkable, and an upper quadrant ultrasound two to three weeks ago, which was normal. Her labs showed no anemia. She had a cardiac calcium score that indicated some plaque in the left anterior descending artery, placing her in the 66th percentile for her age.  Her family history is significant for heart disease, with her mother and most of her mother's siblings affected.  She has a history  of smoking, having quit in 1982, and consumes a glass of wine or beer daily. She maintains an active lifestyle, walking her dog daily and engaging in gardening, without experiencing discomfort during these activities. She takes Aleve  infrequently, about once a month, for headaches or arthritis.      She  reports that she has quit smoking. She has never been exposed to tobacco smoke. She has never used smokeless tobacco. She reports current alcohol use of about 1.0 standard drink of alcohol per week. She reports that she does not use drugs.  RELEVANT GI HISTORY, IMAGING AND LABS: Results   LABS Cholesterol: 142  RADIOLOGY CT for diarrhea: Normal (2023) Upper quadrant ultrasound: Normal (04/2023)  DIAGNOSTIC Colonoscopy: Polyps removed, diverticulosis, hemorrhoids (02/2022) EKG: Normal Cardiac calcium score: Left anterior descending artery: 90.5, 66th percentile for age      CBC    Component Value Date/Time   WBC 5.2 03/16/2023 1130   RBC 4.80 03/16/2023 1130   HGB 15.0 03/16/2023 1130   HCT 45.5 03/16/2023 1130   PLT 260.0 03/16/2023 1130   MCV 94.9 03/16/2023 1130   MCV 92.3 06/13/2011 1431   MCH 31.4 08/30/2016 1214   MCHC 32.9 03/16/2023 1130   RDW 13.6 03/16/2023 1130   Recent Labs    03/16/23 1130  HGB 15.0    CMP     Component Value Date/Time   NA 141 03/16/2023 1130   K 4.4 03/16/2023 1130   CL 104 03/16/2023 1130   CO2 29 03/16/2023 1130   GLUCOSE 93 03/16/2023 1130   BUN 12 03/16/2023 1130   CREATININE 0.68 03/16/2023 1130   CALCIUM 9.6 03/16/2023 1130   PROT 7.1 03/16/2023 1130   ALBUMIN 4.5 03/16/2023 1130   AST 20 03/16/2023 1130   ALT 13 03/16/2023 1130   ALKPHOS 48 03/16/2023 1130   BILITOT 0.9 03/16/2023 1130   GFRNONAA >60 08/30/2016 1214   GFRAA >60 08/30/2016 1214      Latest Ref Rng & Units 03/16/2023   11:30 AM 11/17/2021   11:49 AM 11/04/2020    1:42 PM  Hepatic Function  Total Protein 6.0 - 8.3 g/dL 7.1  7.2  6.9   Albumin 3.5 - 5.2 g/dL 4.5  4.3  4.3   AST 0 - 37 U/L 20  19  17    ALT 0 - 35 U/L 13  13  14    Alk Phosphatase 39 - 117 U/L 48  53  46   Total Bilirubin 0.2 - 1.2 mg/dL 0.9  0.8  0.8       Current Medications:     Current Outpatient  Medications (Cardiovascular):    EPINEPHrine (EPIPEN 2-PAK) 0.3 mg/0.3 mL IJ SOAJ injection, Inject 0.3 mg into the muscle as needed for anaphylaxis. (Patient not taking: Reported on 05/16/2023)         Current Outpatient Medications (Other):    AMBULATORY NON FORMULARY MEDICATION, 5 mg daily. Medication Name: occu plus with lutein   Calcium Carb-Cholecalciferol (TGT CALCIUM DIETARY SUPPLEMENT PO),    Glucosamine HCl (GLUCOSAMINE PO), Take by mouth.   METAMUCIL FIBER PO, Take by mouth daily. 1 tablespoon   Multiple Vitamin (MULTI-VITAMIN DAILY PO), Take by mouth.   pantoprazole (PROTONIX) 40 MG tablet, Take 1 tablet (40 mg total) by mouth daily.   THEANINE PO, Take by mouth as needed.   Turmeric (QC TUMERIC COMPLEX PO), Take 550 mg by mouth daily.  Current Facility-Administered Medications (Other):    0.9 %  sodium  chloride infusion  Medical History:  Past Medical History:  Diagnosis Date   Arthritis    Cataract    Fibromyalgia    Glaucoma    Kidney stones    Osteoporosis    Allergies:  Allergies  Allergen Reactions   Codeine Swelling   Erythromycin    Penicillins Rash   Povidone Iodine Dermatitis and Rash   Sulfa Antibiotics Swelling and Rash     Surgical History:  She  has a past surgical history that includes Cesarean section; Eye surgery; Spine surgery; and Cervical spine surgery. Family History:  Her family history includes Breast cancer in her paternal grandmother; Diabetes in her father; Prostate cancer in her brother, father, and sister.  REVIEW OF SYSTEMS  : All other systems reviewed and negative except where noted in the History of Present Illness.  PHYSICAL EXAM: BP 124/78   Pulse 75   Ht 4\' 11"  (1.499 m)   Wt 114 lb 8 oz (51.9 kg)   BMI 23.13 kg/m  Physical Exam   GENERAL APPEARANCE: Well nourished, in no apparent distress. HEENT: No cervical lymphadenopathy, unremarkable thyroid, sclerae anicteric, conjunctiva pink. RESPIRATORY: Respiratory  effort normal, BS equal bilateral without rales, rhonchi, wheezing. CARDIO: RRR with no MRGs, peripheral pulses intact. ABDOMEN: Soft, non-distended, active bowel sounds in all 4 quadrants, non-tender, no rebound, no mass appreciated, no right upper quadrant or epigastric pain. RECTAL: Declines. MUSCULOSKELETAL: Full ROM, normal gait, without edema. SKIN: Dry, intact without rashes or lesions. No jaundice. NEURO: Alert, oriented, no focal deficits. PSYCH: Cooperative, normal mood and affect. EXTREMITIES: No swelling in legs.      Doree Albee, PA-C 10:10 AM

## 2023-05-16 ENCOUNTER — Ambulatory Visit: Payer: PPO | Admitting: Physician Assistant

## 2023-05-16 ENCOUNTER — Encounter: Payer: Self-pay | Admitting: Physician Assistant

## 2023-05-16 VITALS — BP 124/78 | HR 75 | Ht 59.0 in | Wt 114.5 lb

## 2023-05-16 DIAGNOSIS — K219 Gastro-esophageal reflux disease without esophagitis: Secondary | ICD-10-CM

## 2023-05-16 DIAGNOSIS — R1319 Other dysphagia: Secondary | ICD-10-CM | POA: Diagnosis not present

## 2023-05-16 NOTE — Patient Instructions (Signed)
 You have been scheduled for a Barium Esophogram at [facility] on 05/22/23 at 10:00am. Please arrive 30 minutes prior to your appointment for registration. Make certain not to have anything to eat or drink 3 hours prior to your test. If you need to reschedule for any reason, please contact radiology at [phone] to do so. __________________________________________________________________ A barium swallow is an examination that concentrates on views of the esophagus. This tends to be a double contrast exam (barium and two liquids which, when combined, create a gas to distend the wall of the oesophagus) or single contrast (non-ionic iodine based). The study is usually tailored to your symptoms so a good history is essential. Attention is paid during the study to the form, structure and configuration of the esophagus, looking for functional disorders (such as aspiration, dysphagia, achalasia, motility and reflux) EXAMINATION You may be asked to change into a gown, depending on the type of swallow being performed. A radiologist and radiographer will perform the procedure. The radiologist will advise you of the type of contrast selected for your procedure and direct you during the exam. You will be asked to stand, sit or lie in several different positions and to hold a small amount of fluid in your mouth before being asked to swallow while the imaging is performed .In some instances you may be asked to swallow barium coated marshmallows to assess the motility of a solid food bolus. The exam can be recorded as a digital or video fluoroscopy procedure. POST PROCEDURE It will take 1-2 days for the barium to pass through your system. To facilitate this, it is important, unless otherwise directed, to increase your fluids for the next 24-48hrs and to resume your normal diet.  This test typically takes about 30 minutes to  perform. __________________________________________________________________________________  If the specimen date and time (can be found in the upper right boxed portion of the sheet) are not filled out on the E-Req sheet, the test will NOT be performed.   Please take your proton pump inhibitor medication, protonix 40 mg once a day  Please take this medication 30 minutes to 1 hour before meals- this makes it more effective.  Avoid spicy and acidic foods Avoid fatty foods Limit your intake of coffee, tea, alcohol, and carbonated drinks Work to maintain a healthy weight Keep the head of the bed elevated at least 3 inches with blocks or a wedge pillow if you are having any nighttime symptoms Stay upright for 2 hours after eating Avoid meals and snacks three to four hours before bedtime  We are ordering a "Diatherix" stool testing for you to take home and complete.  You have received a kit from our office today containing all necessary supplies to complete this test.  Please carefully read the stool collection instructions provided in the kit before opening the accompanying materials  OR an easier way is to please use toilet paper to wipe after your bowel movement and use the qtip/applicator provided to get a small volume of the stool from the toilet paper and place that in the tube.    Important to remember: -Place the label onto the "puritan opti-swab" TUBE. -This label should include your full name and date of birth.  - This label should have the DATE AND TIME stool was collects -After completing the test, you should secure the tube into the specimen biohazard bag.  -The laboratory request information sheet (including date and time of specimen collection) should be placed into the outside pocket  of the specimen biohazard bag and returned to the [facility] lab with 2 days of collection.   If it is greater than two days from collection you will be asked to repeat the test.  If the label is  missing from the tube with your name, date of birth, date and time of collection on it, you will have to repeat the test.  Any questions please message [place] on my chart or call the office at [phone]  _______________________________________________________  If your blood pressure at your visit was 140/90 or greater, please contact your primary care physician to follow up on this.  _______________________________________________________  If you are age 6 or older, your body mass index should be between 23-30. Your Body mass index is 23.13 kg/m. If this is out of the aforementioned range listed, please consider follow up with your Primary Care Provider.  If you are age 45 or younger, your body mass index should be between 19-25. Your Body mass index is 23.13 kg/m. If this is out of the aformentioned range listed, please consider follow up with your Primary Care Provider.   ________________________________________________________  The  GI providers would like to encourage you to use [facility] to communicate with providers for non-urgent requests or questions.  Due to long hold times on the telephone, sending your provider a message by [facility] may be a faster and more efficient way to get a response.  Please allow 48 business hours for a response.  Please remember that this is for non-urgent requests.  _______________________________________________________

## 2023-05-22 ENCOUNTER — Ambulatory Visit (HOSPITAL_COMMUNITY)
Admission: RE | Admit: 2023-05-22 | Discharge: 2023-05-22 | Disposition: A | Discharge status: Home / Self Care | Source: Ambulatory Visit | Attending: Physician Assistant | Admitting: Physician Assistant

## 2023-05-22 DIAGNOSIS — R1319 Other dysphagia: Secondary | ICD-10-CM | POA: Diagnosis not present

## 2023-05-22 DIAGNOSIS — K224 Dyskinesia of esophagus: Secondary | ICD-10-CM | POA: Diagnosis not present

## 2023-05-22 DIAGNOSIS — R12 Heartburn: Secondary | ICD-10-CM | POA: Diagnosis not present

## 2023-05-22 DIAGNOSIS — K219 Gastro-esophageal reflux disease without esophagitis: Secondary | ICD-10-CM | POA: Insufficient documentation

## 2023-05-22 DIAGNOSIS — R1310 Dysphagia, unspecified: Secondary | ICD-10-CM | POA: Diagnosis not present

## 2023-05-22 NOTE — Progress Notes (Signed)
 Addendum: Reviewed and agree with assessment and management plan. Asha Grumbine, Carie Caddy, MD

## 2023-05-24 ENCOUNTER — Telehealth: Payer: Self-pay

## 2023-05-24 NOTE — Telephone Encounter (Signed)
 Patient is also requsting a call back from [facility] to discuss barium swallow results.

## 2023-05-24 NOTE — Telephone Encounter (Signed)
 Contacted patient and patient verbalized understanding of negative h pylori stool test.

## 2023-05-25 NOTE — Telephone Encounter (Signed)
 Patient has been scheduled for 6/4 at 10:40 with Marchelle Folks.

## 2023-05-25 NOTE — Telephone Encounter (Signed)
 Called patient the evening of 04/02 and discussed Barium swallow.  We discussed the barium swallow did not show any obvious stenosis but there was a delay in the barium tablet, could be GERD dysmotility as there was reflux noted.  She will increase her Protonix to 40 mg twice daily and we will get a follow-up in 3 months to see how she is doing.  Patient states she would still prefer to avoid an endoscopy if is not necessary.  Can discuss her symptoms next office visit and possibility of EGD. We also discussed after 2 to 3 months of 40 mg twice daily could go back to once daily but if her symptoms return will yet again suggest endoscopic evaluation.

## 2023-06-01 ENCOUNTER — Encounter: Payer: Self-pay | Admitting: Physician Assistant

## 2023-06-09 ENCOUNTER — Encounter: Payer: Self-pay | Admitting: Family Medicine

## 2023-06-09 DIAGNOSIS — R0789 Other chest pain: Secondary | ICD-10-CM

## 2023-06-13 NOTE — Addendum Note (Signed)
 Addended by: Gates Kasal C on: 06/13/2023 02:27 PM   Modules accepted: Orders

## 2023-06-15 ENCOUNTER — Encounter: Payer: Self-pay | Admitting: Cardiology

## 2023-06-19 ENCOUNTER — Telehealth (HOSPITAL_COMMUNITY): Payer: Self-pay | Admitting: *Deleted

## 2023-06-19 ENCOUNTER — Other Ambulatory Visit (HOSPITAL_BASED_OUTPATIENT_CLINIC_OR_DEPARTMENT_OTHER): Payer: Self-pay

## 2023-06-19 DIAGNOSIS — R079 Chest pain, unspecified: Secondary | ICD-10-CM

## 2023-06-19 MED ORDER — PANTOPRAZOLE SODIUM 40 MG PO TBEC
40.0000 mg | DELAYED_RELEASE_TABLET | Freq: Two times a day (BID) | ORAL | 0 refills | Status: AC
  Filled 2023-06-19 – 2023-06-20 (×3): qty 180, 90d supply, fill #0
  Filled ????-??-??: fill #0

## 2023-06-19 MED ORDER — METOPROLOL TARTRATE 50 MG PO TABS
ORAL_TABLET | ORAL | 0 refills | Status: DC
  Filled 2023-06-19: qty 1, 1d supply, fill #0

## 2023-06-19 NOTE — Addendum Note (Signed)
 Addended by: Kerri Peed A on: 06/19/2023 03:24 PM   Modules accepted: Orders

## 2023-06-19 NOTE — Telephone Encounter (Signed)
 Attempted to call patient regarding upcoming cardiac CT appointment. Patient request to call back.  Kerri Peed RN Navigator Cardiac Imaging [facility] [phone] Office

## 2023-06-19 NOTE — Telephone Encounter (Signed)
 Reaching out to patient to offer assistance regarding upcoming cardiac imaging study; pt verbalizes understanding of appt date/time, parking situation and where to check in, pre-test NPO status and medications ordered, and verified current allergies; name and call back number provided for further questions should they arise Johney Frame RN Navigator Cardiac Imaging [facility] [phone] office [phone] cell

## 2023-06-20 ENCOUNTER — Ambulatory Visit (HOSPITAL_COMMUNITY)
Admission: RE | Admit: 2023-06-20 | Discharge: 2023-06-20 | Disposition: A | Discharge status: Home / Self Care | Source: Ambulatory Visit | Attending: Internal Medicine | Admitting: Internal Medicine

## 2023-06-20 ENCOUNTER — Other Ambulatory Visit (HOSPITAL_BASED_OUTPATIENT_CLINIC_OR_DEPARTMENT_OTHER): Payer: Self-pay

## 2023-06-20 DIAGNOSIS — I517 Cardiomegaly: Secondary | ICD-10-CM | POA: Diagnosis not present

## 2023-06-20 DIAGNOSIS — I2510 Atherosclerotic heart disease of native coronary artery without angina pectoris: Secondary | ICD-10-CM | POA: Diagnosis not present

## 2023-06-20 DIAGNOSIS — R0789 Other chest pain: Secondary | ICD-10-CM

## 2023-06-20 MED ORDER — DILTIAZEM HCL 25 MG/5ML IV SOLN: 10.0000 mg | INTRAVENOUS | Status: DC | PRN

## 2023-06-20 MED ORDER — IOHEXOL 350 MG/ML SOLN
100.0000 mL | Freq: Once | INTRAVENOUS | Status: AC | PRN
  Administered 2023-06-20: 100 mL via INTRAVENOUS

## 2023-06-20 MED ORDER — NITROGLYCERIN 0.4 MG SL SUBL
0.8000 mg | SUBLINGUAL_TABLET | Freq: Once | SUBLINGUAL | Status: AC
  Administered 2023-06-20: 0.8 mg via SUBLINGUAL

## 2023-06-20 MED ORDER — NITROGLYCERIN 0.4 MG SL SUBL
SUBLINGUAL_TABLET | SUBLINGUAL | Status: AC
  Filled 2023-06-20: qty 2

## 2023-06-20 MED ORDER — METOPROLOL TARTRATE 5 MG/5ML IV SOLN
10.0000 mg | Freq: Once | INTRAVENOUS | Status: DC | PRN
Start: 2023-06-20 — End: 2023-06-21

## 2023-06-21 ENCOUNTER — Encounter: Payer: Self-pay | Admitting: Family Medicine

## 2023-06-26 ENCOUNTER — Encounter: Payer: Self-pay | Admitting: Cardiology

## 2023-06-26 ENCOUNTER — Ambulatory Visit: Payer: PPO | Attending: Cardiology | Admitting: Cardiology

## 2023-06-26 ENCOUNTER — Other Ambulatory Visit (HOSPITAL_BASED_OUTPATIENT_CLINIC_OR_DEPARTMENT_OTHER): Payer: Self-pay

## 2023-06-26 VITALS — BP 108/68 | HR 68 | Ht 59.0 in | Wt 114.2 lb

## 2023-06-26 DIAGNOSIS — R079 Chest pain, unspecified: Secondary | ICD-10-CM | POA: Diagnosis not present

## 2023-06-26 DIAGNOSIS — I2510 Atherosclerotic heart disease of native coronary artery without angina pectoris: Secondary | ICD-10-CM | POA: Insufficient documentation

## 2023-06-26 MED ORDER — ATORVASTATIN CALCIUM 10 MG PO TABS
10.0000 mg | ORAL_TABLET | Freq: Every day | ORAL | 3 refills | Status: AC
  Filled 2023-06-26: qty 90, 90d supply, fill #0

## 2023-06-26 MED ORDER — ASPIRIN 81 MG PO TBEC: 81.0000 mg | DELAYED_RELEASE_TABLET | Freq: Every day | ORAL | Status: AC

## 2023-06-26 NOTE — Patient Instructions (Signed)
 Medication Instructions:  Start Aspirin  81 mg once daily Start Atorvastatin 10 mg once daily *If you need a refill on your cardiac medications before your next appointment, please call your pharmacy*  Lab Work: Fasting lipid panel, ALT in 6 weeks If you have labs (blood work) drawn today and your tests are completely normal, you will receive your results only by: MyChart Message (if you have MyChart) OR A paper copy in the mail If you have any lab test that is abnormal or we need to change your treatment, we will call you to review the results.  Testing/Procedures: NONE  Follow-Up: At [facility], you and your health needs are our priority.  As part of our continuing mission to provide you with exceptional heart care, our providers are all part of one team.  This team includes your primary Cardiologist (physician) and Advanced Practice Providers or APPs (Physician Assistants and Nurse Practitioners) who all work together to provide you with the care you need, when you need it.  Your next appointment:   1 year(s)  Provider:   Micael Adas, MD  We recommend signing up for the patient portal called "MyChart".  Sign up information is provided on this After Visit Summary.  MyChart is used to connect with patients for Virtual Visits (Telemedicine).  Patients are able to view lab/test results, encounter notes, upcoming appointments, etc.  Non-urgent messages can be sent to your provider as well.   To learn more about what you can do with MyChart, go to [email or web address].   Other Instructions

## 2023-06-26 NOTE — Progress Notes (Signed)
 Cardiology CONSULT Note    Date:  06/26/2023   ID:  NEGAR PASHIA, DOB March 23, 1950, MRN [id number]  PCP:  Kaylee Partridge, MD  Cardiologist:  Gaylyn Keas, MD   Chief Complaint  Patient presents with   New Patient (Initial Visit)    Chest pain    Patient Profile: Ruth Vaughn is a 73 y.o. female who is being seen today for the evaluation of chest pain at the request of Copland, Skipper Dumas, MD.  History of Present Illness:  Ruth Vaughn is a 72 y.o. female who is being seen today for the evaluation of chest pain at the request of Copland, Skipper Dumas, MD.  This is a 73 year old female with a history of fibromyalgia and arthritis who is referred for episodes of chest pain.  She tells me that it feels like a constant pain in the center of her chest that goes into her back.  It is a pressure and sometimes makes her feel very full in her chest. Sometimes it is sharp.  There is no associated sx of N/V/diaphoresis or SOB.  She denies any DOE, PND, orthopnea, dizziness, syncope or palpitations. Occasionally she will have some mild ankle edema. She has smoked in the past but quit in 1982.  Her mom had an MI in her 20's.    She underwent coronary CTA showed coronary Ca score of 85 which is 65th% for age, sex and race matched controls. TPV  moderate, <25% prox and mid LAD.  Her PCP placed her on Protonix  and then was seen by GI and her Protonix  was increased to 40mg  BID.  Her sx have improved and felt to have GERD.  Past Medical History:  Diagnosis Date   Arthritis    CAD (coronary artery disease), native coronary artery    coronary CTA showed coronary Ca score of 62 which is 65th% for age, sex and race matched controls. TPV  moderate, <25% prox and mid LAD   Cataract    Fibromyalgia    Glaucoma    HLD (hyperlipidemia)    Kidney stones    Osteoporosis     Past Surgical History:  Procedure Laterality Date   CERVICAL SPINE SURGERY     CESAREAN SECTION     EYE SURGERY      SPINE SURGERY      Current Medications: Current Meds  Medication Sig   AMBULATORY NON FORMULARY MEDICATION 5 mg daily. Medication Name: occu plus with lutein   Calcium Carb-Cholecalciferol (TGT CALCIUM DIETARY SUPPLEMENT PO)    Glucosamine HCl (GLUCOSAMINE PO) Take by mouth.   METAMUCIL FIBER PO Take by mouth daily. 1 tablespoon   Multiple Vitamin (MULTI-VITAMIN DAILY PO) Take by mouth.   pantoprazole  (PROTONIX ) 40 MG tablet Take 1 tablet (40 mg total) by mouth 2 (two) times daily before a meal.   THEANINE PO Take by mouth as needed.   Current Facility-Administered Medications for the 06/26/23 encounter (Office Visit) with Jacqueline Matsu, MD  Medication   0.9 %  sodium chloride  infusion    Allergies:   Codeine, Erythromycin, Penicillins, Povidone iodine, and Sulfa antibiotics   Social History   Socioeconomic History   Marital status: Divorced    Spouse name: Not on file   Number of children: 2   Years of education: Not on file   Highest education level: Some college, no degree  Occupational History   Not on file  Tobacco Use   Smoking status: Former  Passive exposure: Never   Smokeless tobacco: Never  Vaping Use   Vaping status: Never Used  Substance and Sexual Activity   Alcohol use: Yes    Alcohol/week: 1.0 standard drink of alcohol    Types: 1 Glasses of wine per week    Comment: daily   Drug use: No   Sexual activity: Not on file  Other Topics Concern   Not on file  Social History Narrative   Not on file   Social Drivers of Health   Financial Resource Strain: Low Risk  (03/15/2023)   Overall Financial Resource Strain (CARDIA)    Difficulty of Paying Living Expenses: Not very hard  Food Insecurity: No Food Insecurity (03/15/2023)   Hunger Vital Sign    Worried About Running Out of Food in the Last Year: Never true    Ran Out of Food in the Last Year: Never true  Transportation Needs: No Transportation Needs (03/15/2023)   PRAPARE - Doctor, general practice (Medical): No    Lack of Transportation (Non-Medical): No  Physical Activity: Insufficiently Active (03/15/2023)   Exercise Vital Sign    Days of Exercise per Week: 4 days    Minutes of Exercise per Session: 20 min  Stress: Stress Concern Present (03/15/2023)   [employer] of Occupational Health - Occupational Stress Questionnaire    Feeling of Stress : To some extent  Social Connections: Socially Isolated (03/15/2023)   Social Connection and Isolation Panel [NHANES]    Frequency of Communication with Friends and Family: More than three times a week    Frequency of Social Gatherings with Friends and Family: Once a week    Attends Religious Services: Never    Database administrator or Organizations: No    Attends Engineer, structural: Not on file    Marital Status: Divorced     Family History:  The patient's family history includes Breast cancer in her paternal grandmother; Diabetes in her father; Prostate cancer in her brother, father, and sister.   ROS:   Please see the history of present illness.    ROS All other systems reviewed and are negative.      No data to display             PHYSICAL EXAM:   VS:  BP 108/68   Pulse 68   Ht 4\' 11"  (1.499 m)   Wt 114 lb 3.2 oz (51.8 kg)   SpO2 98%   BMI 23.07 kg/m    GEN: Well nourished, well developed, in no acute distress  HEENT: normal  Neck: no JVD, carotid bruits, or masses Cardiac: RRR; no murmurs, rubs, or gallops,no edema.  Intact distal pulses bilaterally.  Respiratory:  clear to auscultation bilaterally, normal work of breathing GI: soft, nontender, nondistended, + BS MS: no deformity or atrophy  Skin: warm and dry, no rash Neuro:  Alert and Oriented x 3, Strength and sensation are intact Psych: euthymic mood, full affect  Wt Readings from Last 3 Encounters:  06/26/23 114 lb 3.2 oz (51.8 kg)  05/16/23 114 lb 8 oz (51.9 kg)  04/19/23 113 lb 6.4 oz (51.4 kg)      Studies/Labs  Reviewed:       Cardiac Studies & Procedures   ______________________________________________________________________________________________   STRESS TESTS  EXERCISE TOLERANCE TEST (ETT) 11/12/2019  Narrative  Blood pressure demonstrated a normal response to exercise.  There was no ST segment deviation noted during stress.  No T wave inversion  was noted during stress.  Negative, adequate stress test.        CT SCANS  CT CORONARY MORPH W/CTA COR W/SCORE 06/20/2023  Narrative CLINICAL DATA:  Chest pain  EXAM: Cardiac/Coronary CTA  TECHNIQUE: A non-contrast, gated CT scan was obtained with axial slices of 2.5 mm through the heart for calcium scoring. Calcium scoring was performed using the Agatston method. A 120 kV prospective, gated, contrast cardiac CT scan was obtained. Gantry rotation speed was 230 msec and collimation was 0.63 mm. Two sublingual nitroglycerin  tablets (0.8 mg) were given. The 3D data set was reconstructed with motion correction for the best systolic or diastolic phase. Images were analyzed on a dedicated workstation using MPR, MIP, and VRT modes. The patient received 95 cc of contrast.  FINDINGS: Image quality: Good  Noise artifact is: Limited.  Coronary Arteries:  Normal coronary origin.  Left dominance.  Left main: The left main is a large caliber vessel with a normal take off from the left coronary cusp that bifurcates to form a left anterior descending artery and a left circumflex artery. There is no plaque or stenosis.  Left anterior descending artery: The LAD is patent. The LAD gives off 2 patent diagonal branches. Calcified plaque in proximal LAD causes 0-24% stenosis. Mixed plaque in mid LAD causes 0-24% stenosis  Left circumflex artery: The LCX is dominant and patent. The LCX gives off 3 patent obtuse marginal branches and PDA and left posterolateral branches  Right coronary artery: The RCA is small and nondominant with  normal take off from the right coronary cusp. There is no evidence of plaque or stenosis.  Right Atrium: Right atrial size is within normal limits.  Right Ventricle: The right ventricular cavity is within normal limits.  Left Atrium: Left atrial size is mildly enlarged with no left atrial appendage filling defect.  Left Ventricle: The ventricular cavity size is within normal limits.  Pulmonary arteries: Normal in size.  Pulmonary veins: Normal pulmonary venous drainage.  Pericardium: Normal thickness without significant effusion or calcium present.  Cardiac valves: The aortic valve is trileaflet without significant calcification. The mitral valve is normal without significant calcification.  Aorta: Normal caliber without significant disease.  Extra-cardiac findings: See attached radiology report for non-cardiac structures.  IMPRESSION: 1. Coronary calcium score of 85. This was 65th percentile for age-, sex, and race-matched controls.  2. Total plaque volume 129mm3 which is 31st percentile for age- and sex-matched controls (calcified plaque 50mm3; non-calcified plaque 120mm3). TPV is moderate  3. Normal coronary origin with left dominance.  4. Nonobstructive CAD, with minimal (0-24%) stenosis in proximal/mid LAD  RECOMMENDATIONS: CAD-RADS 1: Minimal non-obstructive CAD (0-24%). Consider non-atherosclerotic causes of chest pain. Consider preventive therapy and risk factor modification.   Electronically Signed By: Carson Clara M.D. On: 06/21/2023 11:57   CT SCANS  CT CARDIAC SCORING (SELF PAY ONLY) 03/23/2023  Addendum 03/30/2023  3:25 PM ADDENDUM REPORT: 03/30/2023 15:22  EXAM: OVER-READ INTERPRETATION CT CHEST  The following report is an over-read performed by radiologist Dr. Tita Form of [facility] on 03/30/2023. This over-read does not include interpretation of cardiac or coronary anatomy or pathology. The coronary calcium score  interpretation by the cardiologist is attached.  COMPARISON:  Chest radiograph dated 08/30/2016  FINDINGS: Cardiovascular: Vascular calcifications are seen in the thoracic aorta. Normal heart size. No pericardial effusion.  Mediastinum/Nodes: No enlarged mediastinal lymph nodes. The visible trachea and esophagus demonstrate no significant findings.  Lungs/Pleura: The visible lungs are clear. No pleural  effusion.  Upper Abdomen: No acute abnormality.  Musculoskeletal: Degenerative changes are seen in the spine.  IMPRESSION: 1. No acute findings in the chest.   Electronically Signed By: Tita Form M.D. On: 03/30/2023 15:22  Narrative : CLINICAL DATA:  Cardiovascular Disease Risk stratification  EXAM:  Coronary Calcium Score  TECHNIQUE:  A gated, non-contrast computed tomography scan of the heart was  performed using 3mm slice thickness. Axial images were analyzed on a  dedicated workstation. Calcium scoring of the coronary arteries was  performed using the Agatston method.  FINDINGS:  Coronary Calcium Score:  Left main: 0  Left anterior descending artery: 90.5  Left circumflex artery: 0  Right coronary artery: 0  Total: 90.5  Percentile: 66  Pericardium: Normal.  Ascending Aorta: Normal caliber.  Pulmonary artery: Normal caliber  Non-cardiac: See separate report from [facility].  IMPRESSION:  Coronary calcium score of 90.5. This was 89 percentile for age-, race-,  and sex-matched controls.  RECOMMENDATIONS:  Coronary artery calcium (CAC) score is a strong predictor of  incident coronary heart disease (CHD) and provides predictive  information beyond traditional risk factors. CAC scoring is  reasonable to use in the decision to withhold, postpone, or initiate  statin therapy in intermediate-risk or selected borderline-risk  asymptomatic adults (age 70-75 years and LDL-C >=70 to <190 mg/dL)  who do not have diabetes or  established atherosclerotic  cardiovascular disease (ASCVD).* In intermediate-risk (10-year ASCVD  risk >=7.5% to <20%) adults or selected borderline-risk (10-year  ASCVD risk >=5% to <7.5%) adults in whom a CAC score is measured for  the purpose of making a treatment decision the following  recommendations have been made:  If CAC=0, it is reasonable to withhold statin therapy and reassess  in 5 to 10 years, as long as higher risk conditions are absent  (diabetes mellitus, family history of premature CHD in first degree  relatives (males <55 years; females <65 years), cigarette smoking,  or LDL >=190 mg/dL).  If CAC is 1 to 99, it is reasonable to initiate statin therapy for  patients >=32 years of age.  If CAC is >=100 or >=75th percentile, it is reasonable to initiate  statin therapy at any age.  Cardiology referral should be considered for patients with CAC  scores >=400 or >=75th percentile.  *2018 AHA/ACC/AACVPR/AAPA/ABC/ACPM/ADA/AGS/APhA/ASPC/NLA/PCNA  Guideline on the Management of Blood Cholesterol: A Report of the  American College of Cardiology/American Heart Association Task Force  on Clinical Practice Guidelines. J Am Coll Cardiol.  2019;73(24):3168-3209.  Electronically Signed: By: Ralene Burger M.D. On: 03/24/2023 12:40     ______________________________________________________________________________________________      Recent Labs: 03/16/2023: ALT 13; BUN 12; Creatinine, Ser 0.68; Hemoglobin 15.0; Platelets 260.0; Potassium 4.4; Sodium 141; TSH 1.19   Lipid Panel    Component Value Date/Time   CHOL 246 (H) 03/16/2023 1130   TRIG 79.0 03/16/2023 1130   HDL 87.50 03/16/2023 1130   CHOLHDL 3 03/16/2023 1130   VLDL 15.8 03/16/2023 1130   LDLCALC 142 (H) 03/16/2023 1130     Additional studies/ records that were reviewed today include:  Office visit notes from PCP    ASSESSMENT:    1. Chest pain, unspecified type   2. Coronary  artery disease involving native coronary artery of native heart without angina pectoris      PLAN:  In order of problems listed above:  #Chest pain # CAD - CP is somewhat atypical but she has smoked int he past, has HLD and has  a fm hx of CAD - EKG is nonischemic in Jan 2025 at PCP - coronary CTA showed coronary Ca score of 85 which is 65th% for age, sex and race matched controls. TPV  moderate, <25% prox and mid LAD - her sx ultimately related to GERD and have resolved on Protonix  40mg  BID  #HLD -I have personally reviewed and interpreted outside labs performed by patient's PCP which showed LDL 142, HDL 87, ALT 13 03/16/2023 -will see if she has any coronary artery calcifications and if present would recommend statin -Her LDL goal is < 70 but is very leary to go on statin therapy because her mom had problems on it -I encouraged her to start Atorvastatin 10mg  daily and she will let me know if she has any problems on it  Time Spent: 20 minutes total time of encounter, including 15 minutes spent in face-to-face patient care on the date of this encounter. This time includes coordination of care and counseling regarding above mentioned problem list. Remainder of non-face-to-face time involved reviewing chart documents/testing relevant to the patient encounter and documentation in the medical record. I have independently reviewed documentation from referring provider  Followup: 1 year  Medication Adjustments/Labs and Tests Ordered: Current medicines are reviewed at length with the patient today.  Concerns regarding medicines are outlined above.  Medication changes, Labs and Tests ordered today are listed in the Patient Instructions below.  There are no Patient Instructions on file for this visit.   Signed, Gaylyn Keas, MD  06/26/2023 10:49 AM    [facility] [address] [place], [place], [place]  [address] Phone: [phone]; Fax: [phone]

## 2023-06-26 NOTE — Addendum Note (Signed)
 Addended by: Taeshaun Rames N on: 06/26/2023 11:01 AM   Modules accepted: Orders

## 2023-07-05 ENCOUNTER — Ambulatory Visit: Payer: Self-pay | Admitting: Family Medicine

## 2023-07-11 ENCOUNTER — Other Ambulatory Visit (HOSPITAL_BASED_OUTPATIENT_CLINIC_OR_DEPARTMENT_OTHER)

## 2023-07-26 ENCOUNTER — Ambulatory Visit: Admitting: Physician Assistant

## 2023-07-26 ENCOUNTER — Encounter: Payer: Self-pay | Admitting: Physician Assistant

## 2023-07-26 VITALS — BP 104/70 | HR 79 | Ht 59.0 in | Wt 113.0 lb

## 2023-07-26 DIAGNOSIS — Z860101 Personal history of adenomatous and serrated colon polyps: Secondary | ICD-10-CM | POA: Diagnosis not present

## 2023-07-26 DIAGNOSIS — R1013 Epigastric pain: Secondary | ICD-10-CM

## 2023-07-26 DIAGNOSIS — R079 Chest pain, unspecified: Secondary | ICD-10-CM

## 2023-07-26 DIAGNOSIS — K625 Hemorrhage of anus and rectum: Secondary | ICD-10-CM | POA: Diagnosis not present

## 2023-07-26 DIAGNOSIS — R194 Change in bowel habit: Secondary | ICD-10-CM

## 2023-07-26 DIAGNOSIS — D123 Benign neoplasm of transverse colon: Secondary | ICD-10-CM

## 2023-07-26 DIAGNOSIS — K219 Gastro-esophageal reflux disease without esophagitis: Secondary | ICD-10-CM

## 2023-07-26 NOTE — Progress Notes (Signed)
 07/26/2023 KHALEAH DUER [id number] [birth year]  Referring provider: Kaylee Partridge, MD Primary GI doctor: Dr. Bridgett Camps  ASSESSMENT AND PLAN:   GERD with epigastric pressure radiating around to her back, intermittent dysphagia for 3-4 months, rare nausea but no vomiting Worse post prandial, worse ice cream, chocolate, cheese, nocturnal symptoms at night, has woken her up, tums helps 04/25/2023 RUQ no gallstones normal bile ducts unremarkable liver 05/22/2023 barium swallow mild spontaneous GERD mild esophageal dysmotility with delayed contrast distal esophagus, barium tablet passed without difficulty 05/24/2023 H. pylori negative Protonix  40 mg increased to twice daily, has cut back down to once a day in the morning and has cut back on wine to a few times a week No melena, no weight loss, no dysphagia Long discussion about her symptoms, likely due to ETOH/tumeric which she has cut back on it, without alarm symptoms she would prefer to not proceed with EGD at this time -continue protonix  40 mg once daily, take 30 mins an hour before food - given information about alginate therapy -Lifestyle changes discussed, avoid NSAIDS, ETOH, hand out given to the patient has stopped tumeric  - EGD if symptoms return  CAD nonobstructive CTA showed coronary calcium  score of 85 65th percentile  Medically managed  Rectal bleeding/change in bowel habits 12/10/2021 CT abdomen pelvis for diarrhea unremarkable 03/12/2022 colonoscopy good bowel prep 8 mm polyp 7 mm polyp 5 mm polyp 3 mm polyp diverticulosis hepatic flexure small internal hemorrhoids - improved with fiber supplement - no rectal bleeding  Personal history of tubular adenomatous polyps 3 TA polyps colonoscopy 03/12/2022 recall 3 years Recall 02/2025  Patient Care Team: Copland, Skipper Dumas, MD as PCP - General (Family Medicine) Amedeo Jupiter, MD as Consulting Physician (Ophthalmology)  HISTORY OF PRESENT ILLNESS: 73 y.o. female  with a past medical history listed below, known to Dr. Bridgett Camps presents with GERD.  Discussed the use of AI scribe software for clinical note transcription with the patient, who gave verbal consent to proceed.  History of Present Illness   JENYA PUTZ is a 73 year old female who presents with symptoms of esophagitis and reflux.  She has experienced improvement in symptoms of esophagitis and reflux since her last visit. Dietary modifications include reducing alcohol intake to a small glass of wine a couple of times a week and limiting coffee to six ounces per day. Nuts, particularly roasted pecans, tend to cause pain when consumed in larger quantities.  Protonix  intake has been reduced to once daily, taken in the morning, leading to decreased symptoms of inflammation. No pain at night, and she feels her esophagus is healing. No dark black stools, weight loss, or significant trouble swallowing, although mild difficulty swallowing occurs occasionally. A barium swallow test was performed as part of her prior workup. Symptoms began around November or December of the previous year.  She has stopped taking turmeric supplements, which she believes may have contributed to her symptoms. An increase in inflammation was noticed shortly after discontinuing turmeric, which she had been using for joint pain.  Bowel movements are regular with the use of a fiber supplement. No recent rectal bleeding.      She  reports that she has quit smoking. She has never been exposed to tobacco smoke. She has never used smokeless tobacco. She reports current alcohol use of about 1.0 standard drink of alcohol per week. She reports that she does not use drugs.  RELEVANT GI HISTORY, IMAGING AND LABS: Results  LABS H. pylori: negative  RADIOLOGY Abdominal ultrasound: normal Barium swallow: gastroesophageal reflux Coronary calcium  score: coronary artery plaque present      CBC    Component Value Date/Time   WBC  5.2 03/16/2023 1130   RBC 4.80 03/16/2023 1130   HGB 15.0 03/16/2023 1130   HCT 45.5 03/16/2023 1130   PLT 260.0 03/16/2023 1130   MCV 94.9 03/16/2023 1130   MCV 92.3 06/13/2011 1431   MCH 31.4 08/30/2016 1214   MCHC 32.9 03/16/2023 1130   RDW 13.6 03/16/2023 1130   Recent Labs    03/16/23 1130  HGB 15.0    CMP     Component Value Date/Time   NA 141 03/16/2023 1130   K 4.4 03/16/2023 1130   CL 104 03/16/2023 1130   CO2 29 03/16/2023 1130   GLUCOSE 93 03/16/2023 1130   BUN 12 03/16/2023 1130   CREATININE 0.68 03/16/2023 1130   CALCIUM  9.6 03/16/2023 1130   PROT 7.1 03/16/2023 1130   ALBUMIN 4.5 03/16/2023 1130   AST 20 03/16/2023 1130   ALT 13 03/16/2023 1130   ALKPHOS 48 03/16/2023 1130   BILITOT 0.9 03/16/2023 1130   GFRNONAA >60 08/30/2016 1214   GFRAA >60 08/30/2016 1214      Latest Ref Rng & Units 03/16/2023   11:30 AM 11/17/2021   11:49 AM 11/04/2020    1:42 PM  Hepatic Function  Total Protein 6.0 - 8.3 g/dL 7.1  7.2  6.9   Albumin 3.5 - 5.2 g/dL 4.5  4.3  4.3   AST 0 - 37 U/L 20  19  17    ALT 0 - 35 U/L 13  13  14    Alk Phosphatase 39 - 117 U/L 48  53  46   Total Bilirubin 0.2 - 1.2 mg/dL 0.9  0.8  0.8       Current Medications:     Current Outpatient Medications (Cardiovascular):    EPINEPHrine  (EPIPEN  2-PAK) 0.3 mg/0.3 mL IJ SOAJ injection, Inject 0.3 mg into the muscle as needed for anaphylaxis.   atorvastatin  (LIPITOR) 10 MG tablet, Take 1 tablet (10 mg total) by mouth daily. (Patient not taking: Reported on 07/26/2023)     Current Outpatient Medications (Analgesics):    aspirin  EC 81 MG tablet, Take 1 tablet (81 mg total) by mouth daily. Swallow whole. (Patient not taking: Reported on 07/26/2023)     Current Outpatient Medications (Other):    AMBULATORY NON FORMULARY MEDICATION, 5 mg daily. Medication Name: occu plus with lutein   Calcium  Carb-Cholecalciferol (TGT CALCIUM  DIETARY SUPPLEMENT PO),    Glucosamine HCl (GLUCOSAMINE PO), Take by  mouth.   METAMUCIL FIBER PO, Take by mouth daily. 1 tablespoon   Multiple Vitamin (MULTI-VITAMIN DAILY PO), Take by mouth.   pantoprazole  (PROTONIX ) 40 MG tablet, Take 1 tablet (40 mg total) by mouth 2 (two) times daily before a meal.   THEANINE PO, Take by mouth as needed.  Current Facility-Administered Medications (Other):    0.9 %  sodium chloride  infusion  Medical History:  Past Medical History:  Diagnosis Date   Arthritis    CAD (coronary artery disease), native coronary artery    coronary CTA showed coronary Ca score of 85 which is 65th% for age, sex and race matched controls. TPV  moderate, <25% prox and mid LAD   Cataract    Fibromyalgia    Glaucoma    HLD (hyperlipidemia)    Kidney stones    Osteoporosis    Allergies:  Allergies  Allergen Reactions   Codeine Swelling   Erythromycin    Penicillins Rash   Povidone Iodine Dermatitis and Rash   Sulfa Antibiotics Swelling and Rash     Surgical History:  She  has a past surgical history that includes Cesarean section; Eye surgery; Spine surgery; and Cervical spine surgery. Family History:  Her family history includes Breast cancer in her paternal grandmother; Diabetes in her father; Prostate cancer in her brother, father, and sister.  REVIEW OF SYSTEMS  : All other systems reviewed and negative except where noted in the History of Present Illness.  PHYSICAL EXAM: BP 104/70   Pulse 79   Ht 4\' 11"  (1.499 m)   Wt 113 lb (51.3 kg)   BMI 22.82 kg/m  Physical Exam   GENERAL APPEARANCE: Well nourished, in no apparent distress HEENT: No cervical lymphadenopathy, unremarkable thyroid , sclerae anicteric, conjunctiva pink RESPIRATORY: Respiratory effort normal, BS equal bilateral without rales, rhonchi, wheezing CARDIO: RRR with no MRGs, peripheral pulses intact ABDOMEN: Soft, non distended, active bowel sounds in all 4 quadrants, no tenderness to palpation, no rebound, no mass appreciated RECTAL:  declines MUSCULOSKELETAL: Full ROM, normal gait, without edema SKIN: Dry, intact without rashes or lesions. No jaundice. NEURO: Alert, oriented, no focal deficits PSYCH: Cooperative, normal mood and affect.      Edmonia Gottron, PA-C 11:28 AM

## 2023-07-26 NOTE — Progress Notes (Signed)
 Addendum: Reviewed and agree with assessment and management plan. Asha Grumbine, Carie Caddy, MD

## 2023-07-26 NOTE — Patient Instructions (Addendum)
 Reflux Gourmet Rescue  It is an ALGINATE THERAPY which is the only intervention that works to safeguard the esophagus by creating a protective barrier that actually stops reflux from happening. -The general directions for use are as stated on the packaging: Take 1 teaspoon (5 ml), or more as needed or as directed by your physician, after meals and before bed. -These general directions address the most common times for reflux to occur, but our Rescue products may be taken anytime. Some individuals may take our product preemptively, when they know they will suffer from reflux, or as needed - when discomfort arises. (If taken around food, it should be consumed last.) -You do not have to take 1 teaspoon (5 ml) of the product. While one teaspoon (5ml) may be the perfect average amount to relieve reflux suffering in some, others may require more or less. You may adjust the amount of Mint Chocolate Rescue and Vanilla Caramel Rescue to the lowest amount necessary to meet your individual needs to improve your quality of life. -You may dilute the product if it is too viscous for you to consume. Keep in mind, however, that the thickness of the product was formulated to provide optimal coating and protection of your throat and esophagus. Though diluting the product is possible, it may reduce the protective function and/or length of action. -This can be used in conjunction with reflux medications and lifestyle changes.  100% ALL-NATURAL  Paraben FREE, glycerin FREE, & potassium FREE  Made entirely from all-natural ingredients considered safe for children and during pregnancy  No known side effects  All-natural flavor Gluten FREE  Allergen FREE  Vegan  Can find more information here: NameSeizer.co.[email or web address]  Please take your proton pump inhibitor medication, pantoprazole   Please take this medication 30 minutes to 1 hour before meals- this makes it more effective.  Avoid spicy and  acidic foods Avoid fatty foods Limit your intake of coffee, tea, alcohol, and carbonated drinks Work to maintain a healthy weight Keep the head of the bed elevated at least 3 inches with blocks or a wedge pillow if you are having any nighttime symptoms Stay upright for 2 hours after eating Avoid meals and snacks three to four hours before bedtime

## 2023-07-31 ENCOUNTER — Other Ambulatory Visit (HOSPITAL_BASED_OUTPATIENT_CLINIC_OR_DEPARTMENT_OTHER): Payer: Self-pay

## 2023-07-31 ENCOUNTER — Encounter (INDEPENDENT_AMBULATORY_CARE_PROVIDER_SITE_OTHER): Payer: Self-pay | Admitting: Family Medicine

## 2023-07-31 ENCOUNTER — Other Ambulatory Visit

## 2023-07-31 DIAGNOSIS — R350 Frequency of micturition: Secondary | ICD-10-CM

## 2023-07-31 MED ORDER — NITROFURANTOIN MONOHYD MACRO 100 MG PO CAPS
100.0000 mg | ORAL_CAPSULE | Freq: Two times a day (BID) | ORAL | 0 refills | Status: AC
Start: 2023-07-31 — End: ?
  Filled 2023-07-31: qty 14, 7d supply, fill #0

## 2023-07-31 NOTE — Addendum Note (Signed)
 Addended by: Gates Kasal C on: 07/31/2023 10:06 AM   Modules accepted: Orders

## 2023-07-31 NOTE — Telephone Encounter (Signed)

## 2023-08-01 LAB — URINE CULTURE
MICRO NUMBER:: 16555453
Result:: NO GROWTH
SPECIMEN QUALITY:: ADEQUATE

## 2023-08-07 ENCOUNTER — Encounter: Payer: Self-pay | Admitting: Family Medicine

## 2023-08-07 DIAGNOSIS — H401131 Primary open-angle glaucoma, bilateral, mild stage: Secondary | ICD-10-CM | POA: Diagnosis not present

## 2023-08-07 DIAGNOSIS — H524 Presbyopia: Secondary | ICD-10-CM | POA: Diagnosis not present

## 2023-08-28 ENCOUNTER — Other Ambulatory Visit (HOSPITAL_COMMUNITY): Payer: Self-pay

## 2023-11-06 ENCOUNTER — Telehealth: Payer: Self-pay | Admitting: Pharmacist

## 2023-11-06 NOTE — Progress Notes (Signed)
 Pharmacy Quality Measure Review  This patient is appearing on a report for being at risk of failing the adherence measure for cholesterol (statin) medications this calendar year.   Medication: atorvastatin   Last fill date: 06/26/2023 for 90 day supply - this was initial fill. Started at visit with Dr. Shlomo, cardiologist.   She has a history of fibromyalgia and arthritis   Coronary CTA showed coronary Ca score of 85 which is 65th% for age, sex and race matched controls. TPV moderate, <25% prox and mid LAD   Spoke with Mrs. Trudo today. She reports that she took atorvastatin  for 5 days and experienced muscle aches and pain so she stopped atorvastatin . She does not want to take any cholesterol lowering medication. She feels that she does not need it.  Discussed that her LDL was elevated about goal. Based on diagnosis of CAD (on problem list) LDL goal would be < 70.  Discussed that there were possible non statin cholesterol lowering medications we could try but patient declined.  Will forward to PCP, Dr Watt and cardiologist, Dr Shlomo for review.   Madelin Ray, PharmD Clinical Pharmacist [facility]

## 2023-11-07 ENCOUNTER — Encounter: Payer: Self-pay | Admitting: Family Medicine
